# Patient Record
Sex: Male | Born: 1950 | Race: White | Hispanic: No | Marital: Married | Smoking: Never smoker
Health system: Southern US, Community
[De-identification: ages and names within clinical notes are randomized; demographics above are authoritative.]

## PROBLEM LIST (undated history)

## (undated) DIAGNOSIS — Z950 Presence of cardiac pacemaker: Secondary | ICD-10-CM

## (undated) DIAGNOSIS — M1990 Unspecified osteoarthritis, unspecified site: Secondary | ICD-10-CM

## (undated) DIAGNOSIS — R0600 Dyspnea, unspecified: Secondary | ICD-10-CM

## (undated) DIAGNOSIS — I10 Essential (primary) hypertension: Secondary | ICD-10-CM

## (undated) DIAGNOSIS — I639 Cerebral infarction, unspecified: Secondary | ICD-10-CM

## (undated) DIAGNOSIS — I509 Heart failure, unspecified: Secondary | ICD-10-CM

## (undated) DIAGNOSIS — K219 Gastro-esophageal reflux disease without esophagitis: Secondary | ICD-10-CM

## (undated) DIAGNOSIS — M329 Systemic lupus erythematosus, unspecified: Secondary | ICD-10-CM

## (undated) DIAGNOSIS — Z9889 Other specified postprocedural states: Secondary | ICD-10-CM

## (undated) DIAGNOSIS — K529 Noninfective gastroenteritis and colitis, unspecified: Secondary | ICD-10-CM

## (undated) DIAGNOSIS — G8929 Other chronic pain: Secondary | ICD-10-CM

## (undated) DIAGNOSIS — J45909 Unspecified asthma, uncomplicated: Secondary | ICD-10-CM

## (undated) DIAGNOSIS — E785 Hyperlipidemia, unspecified: Secondary | ICD-10-CM

## (undated) DIAGNOSIS — R112 Nausea with vomiting, unspecified: Secondary | ICD-10-CM

## (undated) DIAGNOSIS — R011 Cardiac murmur, unspecified: Secondary | ICD-10-CM

## (undated) DIAGNOSIS — G4730 Sleep apnea, unspecified: Secondary | ICD-10-CM

## (undated) DIAGNOSIS — J449 Chronic obstructive pulmonary disease, unspecified: Secondary | ICD-10-CM

## (undated) DIAGNOSIS — M545 Low back pain, unspecified: Secondary | ICD-10-CM

## (undated) HISTORY — PX: BACK SURGERY: SHX140

## (undated) HISTORY — PX: AORTIC VALVE REPLACEMENT: SHX41

## (undated) HISTORY — PX: INSERT / REPLACE / REMOVE PACEMAKER: SUR710

## (undated) HISTORY — PX: CARDIAC CATHETERIZATION: SHX172

## (undated) HISTORY — PX: CHOLECYSTECTOMY: SHX55

## (undated) HISTORY — PX: HERNIA REPAIR: SHX51

---

## 2008-03-08 DIAGNOSIS — I7300 Raynaud's syndrome without gangrene: Secondary | ICD-10-CM | POA: Insufficient documentation

## 2009-03-06 DIAGNOSIS — K219 Gastro-esophageal reflux disease without esophagitis: Secondary | ICD-10-CM | POA: Insufficient documentation

## 2009-08-09 DIAGNOSIS — Q231 Congenital insufficiency of aortic valve: Secondary | ICD-10-CM | POA: Insufficient documentation

## 2011-04-18 DIAGNOSIS — Z7901 Long term (current) use of anticoagulants: Secondary | ICD-10-CM | POA: Insufficient documentation

## 2013-04-16 DIAGNOSIS — M7541 Impingement syndrome of right shoulder: Secondary | ICD-10-CM | POA: Insufficient documentation

## 2015-05-29 DIAGNOSIS — Z950 Presence of cardiac pacemaker: Secondary | ICD-10-CM | POA: Insufficient documentation

## 2015-05-29 DIAGNOSIS — I2510 Atherosclerotic heart disease of native coronary artery without angina pectoris: Secondary | ICD-10-CM | POA: Insufficient documentation

## 2015-05-29 DIAGNOSIS — I4891 Unspecified atrial fibrillation: Secondary | ICD-10-CM | POA: Insufficient documentation

## 2015-10-28 DIAGNOSIS — E7800 Pure hypercholesterolemia, unspecified: Secondary | ICD-10-CM | POA: Insufficient documentation

## 2015-10-28 DIAGNOSIS — R911 Solitary pulmonary nodule: Secondary | ICD-10-CM | POA: Insufficient documentation

## 2016-04-15 DIAGNOSIS — E785 Hyperlipidemia, unspecified: Secondary | ICD-10-CM | POA: Insufficient documentation

## 2017-11-04 HISTORY — PX: OTHER SURGICAL HISTORY: SHX169

## 2018-03-19 DIAGNOSIS — I495 Sick sinus syndrome: Secondary | ICD-10-CM | POA: Insufficient documentation

## 2018-03-20 DIAGNOSIS — G4731 Primary central sleep apnea: Secondary | ICD-10-CM | POA: Insufficient documentation

## 2019-06-17 DIAGNOSIS — I493 Ventricular premature depolarization: Secondary | ICD-10-CM | POA: Insufficient documentation

## 2020-02-02 DIAGNOSIS — I509 Heart failure, unspecified: Secondary | ICD-10-CM | POA: Insufficient documentation

## 2020-04-06 ENCOUNTER — Emergency Department (HOSPITAL_BASED_OUTPATIENT_CLINIC_OR_DEPARTMENT_OTHER)
Admission: EM | Admit: 2020-04-06 | Discharge: 2020-04-06 | Disposition: A | Discharge status: Home / Self Care | Payer: Medicare Other | Attending: Emergency Medicine | Admitting: Emergency Medicine

## 2020-04-06 ENCOUNTER — Encounter (HOSPITAL_BASED_OUTPATIENT_CLINIC_OR_DEPARTMENT_OTHER): Payer: Self-pay | Admitting: *Deleted

## 2020-04-06 ENCOUNTER — Other Ambulatory Visit: Payer: Self-pay

## 2020-04-06 DIAGNOSIS — G8929 Other chronic pain: Secondary | ICD-10-CM | POA: Diagnosis not present

## 2020-04-06 DIAGNOSIS — Z79899 Other long term (current) drug therapy: Secondary | ICD-10-CM | POA: Insufficient documentation

## 2020-04-06 DIAGNOSIS — I509 Heart failure, unspecified: Secondary | ICD-10-CM | POA: Insufficient documentation

## 2020-04-06 DIAGNOSIS — R21 Rash and other nonspecific skin eruption: Secondary | ICD-10-CM | POA: Insufficient documentation

## 2020-04-06 DIAGNOSIS — M545 Low back pain: Secondary | ICD-10-CM | POA: Diagnosis present

## 2020-04-06 DIAGNOSIS — Z7901 Long term (current) use of anticoagulants: Secondary | ICD-10-CM | POA: Diagnosis not present

## 2020-04-06 DIAGNOSIS — J449 Chronic obstructive pulmonary disease, unspecified: Secondary | ICD-10-CM | POA: Diagnosis not present

## 2020-04-06 DIAGNOSIS — I110 Hypertensive heart disease with heart failure: Secondary | ICD-10-CM | POA: Insufficient documentation

## 2020-04-06 DIAGNOSIS — M5442 Lumbago with sciatica, left side: Secondary | ICD-10-CM | POA: Diagnosis not present

## 2020-04-06 HISTORY — DX: Low back pain, unspecified: M54.50

## 2020-04-06 HISTORY — DX: Hyperlipidemia, unspecified: E78.5

## 2020-04-06 HISTORY — DX: Essential (primary) hypertension: I10

## 2020-04-06 HISTORY — DX: Other chronic pain: G89.29

## 2020-04-06 HISTORY — DX: Cerebral infarction, unspecified: I63.9

## 2020-04-06 HISTORY — DX: Heart failure, unspecified: I50.9

## 2020-04-06 HISTORY — DX: Unspecified asthma, uncomplicated: J45.909

## 2020-04-06 HISTORY — DX: Chronic obstructive pulmonary disease, unspecified: J44.9

## 2020-04-06 MED ORDER — OXYCODONE-ACETAMINOPHEN 5-325 MG PO TABS
1.0000 | ORAL_TABLET | Freq: Once | ORAL | Status: AC
  Administered 2020-04-06: 1 via ORAL
  Filled 2020-04-06: qty 1

## 2020-04-06 MED ORDER — ONDANSETRON HCL 4 MG PO TABS: 4.0000 mg | ORAL_TABLET | Freq: Four times a day (QID) | ORAL | 0 refills | Status: AC

## 2020-04-06 MED ORDER — OXYCODONE-ACETAMINOPHEN 5-325 MG PO TABS: 2.0000 | ORAL_TABLET | ORAL | 0 refills | Status: DC | PRN

## 2020-04-06 MED ORDER — ONDANSETRON 4 MG PO TBDP
4.0000 mg | ORAL_TABLET | Freq: Once | ORAL | Status: AC
  Administered 2020-04-06: 4 mg via ORAL
  Filled 2020-04-06: qty 1

## 2020-04-06 NOTE — Discharge Instructions (Signed)
Please take the pain medication as prescribed.  Please return to the ER if you have increasing numbness, inability to hold your bowel or bladder, sudden inability to walk, increasing in fever/chills.  Please follow-up with your spine doctor within the next few days.

## 2020-04-06 NOTE — ED Provider Notes (Signed)
[facility] EMERGENCY DEPARTMENT Provider Note   CSN: [id number] Arrival date & time: 04/06/20  1037     History Chief Complaint  Patient presents with  . Back Pain    Brent Nichols is a 69 y.o. male.  HPI 69 year old male with a history of chronic bilateral low back pain, asthma, stroke, COPD, hypertension, CHF, hyperlipidemia presents to the ER with worsening left-sided anterior groin and thigh pain x1 day.  History provided by patient and wife who is at bedside.  He reports chronic bilateral low back pain, with "severe nerve damage" in his left leg per his recent neuro conductive study.  He states he is scheduled to have back surgery soon but is waiting on cardiac clearance.  He states that yesterday he had severe pain to his anterior thigh and groin that woke him up in the morning.  He states he has not been able to move his left leg much secondary to the pain.  He states he has a baseline weakness and numbness to that left leg, but no increase in weakness.  No groin numbness, no bowel or bladder incontinence, no sudden foot drop.  Patient states that he spoke with his primary care doctor who told him to come to the ER for pain management.  He also refers that he has started metoprolol and Plavix recently, and has developed a petechial rash on his forearms, legs and abdomen.  The rash is nonpainful, and not itchy.  Per chart review, patient was seen on 03/30/2020 by his PCP for this, INR was within therapeutic range and a CBC was done which showed no acute abnormalities.  Patient states that he takes hydrocodone for his pain, but his medication has not "touched" this increase in pain for him.  He has had no fevers, chills, increasing weakness, night sweats, no history of cancer, no dysuria, testicular swelling, penile pain, history of hernias..    Past Medical History:  Diagnosis Date  . Asthma   . CHF (congestive heart failure) (HCC)   . Chronic bilateral low back pain   . COPD  (chronic obstructive pulmonary disease) (HCC)   . Hyperlipidemia   . Hypertension   . Stroke [facility])     There are no problems to display for this patient.   Past Surgical History:  Procedure Laterality Date  . AORTIC VALVE REPLACEMENT    . BACK SURGERY    . CHOLECYSTECTOMY    . HERNIA REPAIR         History reviewed. No pertinent family history.  Social History   Tobacco Use  . Smoking status: Never Smoker  . Smokeless tobacco: Never Used  Substance Use Topics  . Alcohol use: Not Currently  . Drug use: Not Currently    Home Medications Prior to Admission medications   Medication Sig Start Date End Date Taking? Authorizing Provider  clopidogrel (PLAVIX) 75 MG tablet Take by mouth. 04/04/20 04/04/21 Yes [provider]  esomeprazole (NEXIUM) 40 MG capsule TAKE 1 CAPSULE BY MOUTH EVERY DAY 10/22/19  Yes [provider]  furosemide (LASIX) 40 MG tablet TAKE 1 TABLET BY MOUTH EVERY DAY 01/05/13  Yes [provider]  gabapentin (NEURONTIN) 100 MG capsule Take by mouth. 11/05/16  Yes [provider]  HYDROcodone-acetaminophen (NORCO) 10-325 MG tablet Take by mouth. 04/12/16 04/22/20 Yes [provider]  metoprolol succinate (TOPROL-XL) 50 MG 24 hr tablet Take by mouth. 01/18/20  Yes [provider]  montelukast (SINGULAIR) 10 MG tablet Take by  mouth. 08/14/18  Yes [provider]  rosuvastatin (CRESTOR) 20 MG tablet Take by mouth.   Yes [provider]  warfarin (COUMADIN) 5 MG tablet Take by mouth.   Yes [provider]  albuterol (PROVENTIL) (2.5 MG/3ML) 0.083% nebulizer solution Inhale into the lungs.    [provider]  oxyCODONE-acetaminophen (PERCOCET/ROXICET) 5-325 MG tablet Take 2 tablets by mouth every 4 (four) hours as needed for severe pain. 04/06/20   Mare Ferrari, PA-C    Allergies    Codeine  Review of Systems   Review of Systems  Constitutional: Negative for chills and fatigue.    Gastrointestinal: Negative for abdominal pain, diarrhea and vomiting.  Genitourinary: Negative for dysuria, penile pain, scrotal swelling and testicular pain.  Musculoskeletal: Positive for arthralgias and back pain. Negative for joint swelling, myalgias, neck pain and neck stiffness.  Skin: Positive for rash. Negative for color change and wound.  Neurological: Positive for weakness (Chronic, no increase from baseline) and numbness (Chronic, no increase from baseline).    Physical Exam Updated Vital Signs BP 102/71 (BP Location: Right Arm)   Pulse 66   Temp 98.5 F (36.9 C) (Oral)   Resp 16   Ht 5\' 9"  (1.753 m)   Wt 96.2 kg   SpO2 97%   BMI 31.31 kg/m   Physical Exam Vitals and nursing note reviewed.  Constitutional:      General: He is not in acute distress.    Appearance: Normal appearance. He is well-developed. He is not ill-appearing, toxic-appearing or diaphoretic.  HENT:     Head: Normocephalic and atraumatic.     Nose: Nose normal.     Mouth/Throat:     Mouth: Mucous membranes are moist.  Eyes:     Conjunctiva/sclera: Conjunctivae normal.     Pupils: Pupils are equal, round, and reactive to light.  Cardiovascular:     Rate and Rhythm: Normal rate and regular rhythm.     Heart sounds: No murmur.  Pulmonary:     Effort: Pulmonary effort is normal. No respiratory distress.     Breath sounds: Normal breath sounds.  Abdominal:     Palpations: Abdomen is soft. There is no mass.     Tenderness: There is no abdominal tenderness.  Musculoskeletal:        General: Tenderness present. No swelling, deformity or signs of injury.     Cervical back: Neck supple.     Right lower leg: No edema.     Left lower leg: No edema.     Comments: 3/5 left lower extremity weakness with plantarflexion, 4/5 with flexion and extension of the knee, baseline numbness in left leg.  No L-spine, T-spine, C-spine tenderness.  Range of motion to left leg decreased secondary to pain, but I am still  able to flex and extend at the hip with passive motion.  No evidence of erythema, cellulitis, swelling.  2+ pedal pulses bilaterally.  5/5 strength, sensations intact, full range of motion of lower extremity with flexion, extension, dorsiflexion, plantarflexion.  No evidence of hernia in the groin, no testicular swelling, normal appearing penis.  No swelling or erythema overlying the left knee.  No edema, signs of injury, deformities.  Skin:    General: Skin is warm and dry.     Findings: Erythema and rash present. No bruising.     Comments: Petechial rash on ventral aspects of his forearms, scattered across his abdomen, and on anterior shins.  None evidence of bullae, sloughing, blisters, pustules,  no warmth, draining sinus tracts, no superficial abscesses, no vesicles, desquamation, no target lesions with dusky purpura or central bulla.  Not tender to touch.    Neurological:     Mental Status: He is alert.   .  ED Results / Procedures / Treatments   Labs (all labs ordered are listed, but only abnormal results are displayed) Labs Reviewed - No data to display  EKG None  Radiology No results found.  Procedures Procedures (including critical care time)  Medications Ordered in ED Medications  oxyCODONE-acetaminophen (PERCOCET/ROXICET) 5-325 MG per tablet 1 tablet (1 tablet Oral Given 04/06/20 1216)  ondansetron (ZOFRAN-ODT) disintegrating tablet 4 mg (4 mg Oral Given 04/06/20 1216)    ED Course  I have reviewed the triage vital signs and the nursing notes.  Pertinent labs & imaging results that were available during my care of the patient were reviewed by me and considered in my medical decision making (see chart for details).    MDM Rules/Calculators/A&P                     69 year old male with increasing left leg pain in the setting of chronic low back pain. On presentation to the ER, the patient is alert and oriented, nontoxic-appearing, appears to be mildly in pain, sitting up  on the bed, but in no acute distress.  Vitals are overall reassuring, he is afebrile.  Physical exam with limited range of motion secondary to pain in the left leg, baseline weakness and numbness per patient.  I was still able to passively flex and extend his legs at the hip and knee.  No evidence of septic joint, he is not having any red flag signs concerning for cauda equina or severe spinal stenosis.  I discussed the goals of care with the patient and his wife, their main concern is pain control.  Patient has a mechanical mitral valve and cannot receive an MRI, and we do not have MRI here at [facility].  His last CT was on 03/16/2020 with moderate central canal stenosis at L1-5, and remote L2 compression deformity with no acute fractures.  Patient has not had any recent falls or reinjuries.  I do not think a repeat CT is warranted at this time, as his low back problems are chronic.  I discussed this with the patient and his wife and they are agreeable to not repeating a CT at this time.  Pain treated with Percocet in the ER.  We have agreed on increasing his pain medication to Percocet to hold him over until he gets cardiac clearance and can schedule surgery.  Patient and his wife are agreeable to this.   In regards to his rash, he reports that her has been no changes or increases in the spread of his rash since his appointment on 03/30/2020.  INR and CBC checked then without abnormalities.  There are no signs of SJS, TN, doubt ITP.  I do not think there is an indication to recheck a CBC today.  The pain is not itchy or painful.  Concern for cellulitis low.   The patient was also seen and evaluated by Dr. Rush Landmark who is agreeable to the above plan. Final Clinical Impression(s) / ED Diagnoses Final diagnoses:  Chronic left-sided low back pain with left-sided sciatica    Rx / DC Orders ED Discharge Orders         Ordered    oxyCODONE-acetaminophen (PERCOCET/ROXICET) 5-325 MG tablet  Every 4 hours PRN  04/06/20 [place], PA-C 04/06/20 1308    Tegeler, Gwenyth Allegra, MD 04/06/20 2059

## 2020-04-06 NOTE — ED Triage Notes (Signed)
Pt c/o left lower back pain x 1 day , hx chronic back pain with plans for surgery

## 2020-04-07 ENCOUNTER — Other Ambulatory Visit: Payer: Self-pay | Admitting: Neurological Surgery

## 2020-04-12 NOTE — Progress Notes (Signed)
Your procedure is scheduled on Monday June 14.  Report to [facility] Main Entrance "A" at 05:30 A.M., and check in at the Admitting office.  Call this number if you have problems the morning of surgery: [phone]  Call [phone] if you have any questions prior to your surgery date Monday-Friday 8am-4pm   Remember: Do not eat or drink after midnight the night before your surgery  Take these medicines the morning of surgery with A SIP OF WATER: albuterol (PROVENTIL) nebulizer  esomeprazole (NEXIUM) fluticasone (FLONASE)  fluticasone (FLOVENT HFA) inhaler metoprolol succinate (TOPROL-XL) rosuvastatin (CRESTOR)    IF NEEDED: albuterol (VENTOLIN HFA) inhaler -------- Please bring all inhalers with you the day of surgery.  meclizine (ANTIVERT) ondansetron (ZOFRAN) oxyCODONE-acetaminophen (PERCOCET/ROXICET)   Follow your surgeon's instructions on when to stop clopidogrel (PLAVIX) and warfarin (COUMADIN).   >>If no instructions were given by your surgeon then you will need to call the office to get those instructions.    As of today, STOP taking any Aspirin/Aspirin containing products, Aleve, Naproxen, Ibuprofen, Motrin, Advil, Goody's, BC's, all herbal medications, fish oil, and all vitamins.    The Morning of Surgery  Do not wear jewelry, make-up or nail polish.  Do not wear lotions, powders, colognes, or deodorant Men may shave face and neck.  Do not bring valuables to the hospital.  [facility] is not responsible for any belongings or valuables.  If you are a smoker, DO NOT Smoke 24 hours prior to surgery  If you wear a CPAP at night please bring your mask the morning of surgery   Remember that you must have someone to transport you home after your surgery, and remain with you for 24 hours if you are discharged the same day.   Please bring cases for contacts, glasses, hearing aids, dentures or bridgework because it cannot be worn into surgery.    Leave your  suitcase in the car.  After surgery it may be brought to your room.  For patients admitted to the hospital, discharge time will be determined by your treatment team.  Patients discharged the day of surgery will not be allowed to drive home.    Special instructions:   [place]- Preparing For Surgery  Before surgery, you can play an important role. Because skin is not sterile, your skin needs to be as free of germs as possible. You can reduce the number of germs on your skin by washing with CHG (chlorahexidine gluconate) Soap before surgery.  CHG is an antiseptic cleaner which kills germs and bonds with the skin to continue killing germs even after washing.    Oral Hygiene is also important to reduce your risk of infection.  Remember - BRUSH YOUR TEETH THE MORNING OF SURGERY WITH YOUR REGULAR TOOTHPASTE  Please do not use if you have an allergy to CHG or antibacterial soaps. If your skin becomes reddened/irritated stop using the CHG.  Do not shave (including legs and underarms) for at least 48 hours prior to first CHG shower. It is OK to shave your face.  Please follow these instructions carefully.   1. Shower the NIGHT BEFORE SURGERY and the MORNING OF SURGERY with CHG Soap.   2. If you chose to wash your hair and body, wash as usual with your normal shampoo and body-wash/soap.  3. Rinse your hair and body thoroughly to remove the shampoo and soap.  4. Apply CHG directly to the skin (ONLY FROM THE NECK DOWN) and wash gently with a  scrungie or a clean washcloth.   5. Do not use on open wounds or open sores. Avoid contact with your eyes, ears, mouth and genitals (private parts). Wash Face and genitals (private parts)  with your normal soap.   6. Wash thoroughly, paying special attention to the area where your surgery will be performed.  7. Thoroughly rinse your body with warm water from the neck down.  8. DO NOT shower/wash with your normal soap after using and rinsing off the CHG  Soap.  9. Pat yourself dry with a CLEAN TOWEL.  10. Wear CLEAN PAJAMAS to bed the night before surgery  11. Place CLEAN SHEETS on your bed the night of your first shower and DO NOT SLEEP WITH PETS.  12. Wear comfortable clothes the morning of surgery.     Day of Surgery:  Please shower the morning of surgery with the CHG soap Do not apply any deodorants/lotions. Please wear clean clothes to the hospital/surgery center.   Remember to brush your teeth WITH YOUR REGULAR TOOTHPASTE.   Please read over the following fact sheets that you were given.

## 2020-04-12 NOTE — Progress Notes (Signed)
Pt has procedure scheduled with Dr. Yetta Barre 04/17/20 @ 0730. Pt has pacemaker, device orders requested via email. Arlys John Small called and notified of pt procedure, date, and time. Cyndia Diver, RN cc'd.

## 2020-04-13 ENCOUNTER — Encounter (HOSPITAL_COMMUNITY): Payer: Self-pay

## 2020-04-13 ENCOUNTER — Other Ambulatory Visit: Payer: Self-pay

## 2020-04-13 ENCOUNTER — Other Ambulatory Visit (HOSPITAL_COMMUNITY)
Admission: RE | Admit: 2020-04-13 | Discharge: 2020-04-13 | Disposition: A | Discharge status: Home / Self Care | Payer: Medicare Other | Source: Ambulatory Visit | Attending: Neurological Surgery | Admitting: Neurological Surgery

## 2020-04-13 ENCOUNTER — Encounter (HOSPITAL_COMMUNITY)
Admission: RE | Admit: 2020-04-13 | Discharge: 2020-04-13 | Disposition: A | Discharge status: Home / Self Care | Payer: Medicare Other | Source: Ambulatory Visit | Attending: Neurological Surgery | Admitting: Neurological Surgery

## 2020-04-13 DIAGNOSIS — Z7951 Long term (current) use of inhaled steroids: Secondary | ICD-10-CM | POA: Diagnosis not present

## 2020-04-13 DIAGNOSIS — K219 Gastro-esophageal reflux disease without esophagitis: Secondary | ICD-10-CM | POA: Insufficient documentation

## 2020-04-13 DIAGNOSIS — Z20822 Contact with and (suspected) exposure to covid-19: Secondary | ICD-10-CM | POA: Insufficient documentation

## 2020-04-13 DIAGNOSIS — J449 Chronic obstructive pulmonary disease, unspecified: Secondary | ICD-10-CM | POA: Insufficient documentation

## 2020-04-13 DIAGNOSIS — Z952 Presence of prosthetic heart valve: Secondary | ICD-10-CM | POA: Diagnosis not present

## 2020-04-13 DIAGNOSIS — G4730 Sleep apnea, unspecified: Secondary | ICD-10-CM | POA: Diagnosis not present

## 2020-04-13 DIAGNOSIS — I509 Heart failure, unspecified: Secondary | ICD-10-CM | POA: Diagnosis not present

## 2020-04-13 DIAGNOSIS — Z8673 Personal history of transient ischemic attack (TIA), and cerebral infarction without residual deficits: Secondary | ICD-10-CM | POA: Diagnosis not present

## 2020-04-13 DIAGNOSIS — Z01812 Encounter for preprocedural laboratory examination: Secondary | ICD-10-CM | POA: Insufficient documentation

## 2020-04-13 DIAGNOSIS — Z950 Presence of cardiac pacemaker: Secondary | ICD-10-CM | POA: Insufficient documentation

## 2020-04-13 DIAGNOSIS — Z7901 Long term (current) use of anticoagulants: Secondary | ICD-10-CM | POA: Diagnosis not present

## 2020-04-13 DIAGNOSIS — Z79899 Other long term (current) drug therapy: Secondary | ICD-10-CM | POA: Diagnosis not present

## 2020-04-13 DIAGNOSIS — M329 Systemic lupus erythematosus, unspecified: Secondary | ICD-10-CM | POA: Diagnosis not present

## 2020-04-13 DIAGNOSIS — I110 Hypertensive heart disease with heart failure: Secondary | ICD-10-CM | POA: Diagnosis not present

## 2020-04-13 DIAGNOSIS — M48061 Spinal stenosis, lumbar region without neurogenic claudication: Secondary | ICD-10-CM | POA: Insufficient documentation

## 2020-04-13 DIAGNOSIS — I480 Paroxysmal atrial fibrillation: Secondary | ICD-10-CM | POA: Diagnosis not present

## 2020-04-13 DIAGNOSIS — E785 Hyperlipidemia, unspecified: Secondary | ICD-10-CM | POA: Insufficient documentation

## 2020-04-13 HISTORY — DX: Presence of cardiac pacemaker: Z95.0

## 2020-04-13 HISTORY — DX: Systemic lupus erythematosus, unspecified: M32.9

## 2020-04-13 HISTORY — DX: Gastro-esophageal reflux disease without esophagitis: K21.9

## 2020-04-13 HISTORY — DX: Unspecified osteoarthritis, unspecified site: M19.90

## 2020-04-13 HISTORY — DX: Dyspnea, unspecified: R06.00

## 2020-04-13 HISTORY — DX: Other specified postprocedural states: R11.2

## 2020-04-13 HISTORY — DX: Sleep apnea, unspecified: G47.30

## 2020-04-13 HISTORY — DX: Noninfective gastroenteritis and colitis, unspecified: K52.9

## 2020-04-13 HISTORY — DX: Cardiac murmur, unspecified: R01.1

## 2020-04-13 HISTORY — DX: Other specified postprocedural states: Z98.890

## 2020-04-13 LAB — BASIC METABOLIC PANEL
Anion gap: 10 (ref 5–15)
BUN: 19 mg/dL (ref 8–23)
CO2: 19 mmol/L — ABNORMAL LOW (ref 22–32)
Calcium: 9 mg/dL (ref 8.9–10.3)
Chloride: 109 mmol/L (ref 98–111)
Creatinine, Ser: 0.88 mg/dL (ref 0.61–1.24)
GFR calc Af Amer: >60 mL/min (ref >60)
GFR calc non Af Amer: >60 mL/min (ref >60)
Glucose, Bld: 109 mg/dL — ABNORMAL HIGH (ref 70–99)
Potassium: 3.9 mmol/L (ref 3.5–5.1)
Sodium: 138 mmol/L (ref 135–145)

## 2020-04-13 LAB — CBC WITH DIFFERENTIAL/PLATELET
Abs Immature Granulocytes: 0.01 10*3/uL (ref 0.00–0.07)
Basophils Absolute: 0.1 10*3/uL (ref 0.0–0.1)
Basophils Relative: 2 %
Eosinophils Absolute: 0.4 10*3/uL (ref 0.0–0.5)
Eosinophils Relative: 6 %
HCT: 43.3 % (ref 39.0–52.0)
Hemoglobin: 14.4 g/dL (ref 13.0–17.0)
Immature Granulocytes: 0 %
Lymphocytes Relative: 37 %
Lymphs Abs: 2.5 10*3/uL (ref 0.7–4.0)
MCH: 32 pg (ref 26.0–34.0)
MCHC: 33.3 g/dL (ref 30.0–36.0)
MCV: 96.2 fL (ref 80.0–100.0)
Monocytes Absolute: 0.8 10*3/uL (ref 0.1–1.0)
Monocytes Relative: 13 %
Neutro Abs: 2.9 10*3/uL (ref 1.7–7.7)
Neutrophils Relative %: 42 %
Platelets: 191 10*3/uL (ref 150–400)
RBC: 4.5 MIL/uL (ref 4.22–5.81)
RDW: 13.5 % (ref 11.5–15.5)
WBC: 6.7 10*3/uL (ref 4.0–10.5)
nRBC: 0 % (ref 0.0–0.2)

## 2020-04-13 LAB — SURGICAL PCR SCREEN
MRSA, PCR: NEGATIVE
Staphylococcus aureus: NEGATIVE

## 2020-04-13 LAB — PROTIME-INR
INR: 1.4 — ABNORMAL HIGH (ref 0.8–1.2)
Prothrombin Time: 16.9 seconds — ABNORMAL HIGH (ref 11.4–15.2)

## 2020-04-13 LAB — SARS CORONAVIRUS 2 (TAT 6-24 HRS): SARS Coronavirus 2: NEGATIVE

## 2020-04-13 NOTE — Progress Notes (Signed)
PCP - Carron Curie @ [facility]  Cardiologist - Drucker @ [facility] Pulm: Su Monks PPM/ICD - pacemaker Device Orders - faxed to Dr. Mayme Genta Rep Notified - yes, by Tawanna Sat R.N.  Chest x-ray - 01-12-20 in care everywhere EKG - requested from [facility] Stress Test - request ECHO - request Cardiac Cath - na  Sleep Study - 2 yrs. Ago @ [facility] --Pt. Has a Respicardia, states it comes on 2230 every evening and goes off at 0700 everyday.  Fasting Blood Sugar - na   Blood Thinner Instructions: stopped coumadin and plavix 04-09-20, pt. Started on lovenox 04/09/20 and last dose 04/16/20 Aspirin Instructions:  ERAS Protcol -na   COVID TEST- 04/13/20   Anesthesia review: cardiac,pacemaker, nerve stimulator  Patient denies shortness of breath, fever, cough and chest pain at PAT appointment   All instructions explained to the patient, with a verbal understanding of the material. Patient agrees to go over the instructions while at home for a better understanding. Patient also instructed to self quarantine after being tested for COVID-19. The opportunity to ask questions was provided.

## 2020-04-14 ENCOUNTER — Encounter (HOSPITAL_COMMUNITY): Payer: Self-pay

## 2020-04-14 NOTE — Progress Notes (Signed)
Anesthesia Chart Review:  Case: [id number] Date/Time: 04/17/20 0715   Procedure: Laminectomy and Foraminotomy - L1-L2 - L2-L3 - L3-L4 - L4-L5 (N/A Back)   Anesthesia type: General   Pre-op diagnosis: Stenosis   Location: [facility] OR ROOM 20 / [place] OR   Surgeons: Eustace Moore, MD      DISCUSSION: Patient is a 69 year old male scheduled for the above procedure.  History includes never smoker, post-operative N/V, HTN, HLD, COPD, asthma, CHF, LV dysfunction (LVEF 40-50% 07/2019 echocardiograms. 42% 03/2020 stress test), murmur (s/p St. Jude mechanical AVR; 04/15/16 [facility] note indicates [place] procedure 2005), SSS (s/p St. Jude Assurity MRI PPM pacemaker; generator change 04/06/18), PAF, exertional dyspnea, CVA (~ 2012), central sleep apnea (s/p Rescpicardia phrenic nerve stimulator 2019), GERD, Lupus, ulcerative colitis, chronic low back pain (history back surgery), cholecystectomy.  Evaluated 04/10/20 by PCP Bolivar Haw, PA following ED visit for severe BLE pain, awaiting back surgery. She notes cardiologist Dr. Georg Ruddle had given permission to hold Plavix and arrange Lovenox bridge while warfarin on hold for surgery. Low risk stress test 03/29/20.   Preoperative PT/INR elevated at 16.9/1.4, will order repeat PT/INR on arrival. Last warfarin and Plavix 04/09/20, preoperative Lovenox bridge 04/09/20-04/16/20.   He has a phrenic nerve stimulator that has parameters to turn on at 2230 and turn off at 0700.  Preoperative PPM perioperative device RX form still pending from [facility] (Dr. Georg Ruddle).  04/13/20 preoperative COVID-19 test negative. Anesthesia team to evaluate on the day of surgery.    VS: BP 138/63    Pulse (!) 57    Temp 36.6 C (Oral)    Resp 18    Ht 5\' 9"  (1.753 m)    Wt 94.9 kg    SpO2 98%    BMI 30.90 kg/m    PROVIDERS: Bolivar Haw, PA is PCP [facility], see Care Everywhere)  Wendie Chess, MD is cardiologist ([place], see Care Everywhere). Last evaluation 03/09/20 by Sigurd Sos, NP. She notes dyspnea improved since starting Entresto. BP and weight stable. He did have some atypical chest discomfort, so stress test was ordered which was low risk.   Francena Hanly, MD is pulmonologist ([place]). Last evaluation 08/05/19.   Chales Salmon, MD is neurologist ([place])   LABS: Labs reviewed: Repeat PT/INR on the day of surgery. (all labs ordered are listed, but only abnormal results are displayed)  Labs Reviewed  BASIC METABOLIC PANEL - Abnormal; Notable for the following components:      Result Value   CO2 19 (*)    Glucose, Bld 109 (*)    All other components within normal limits  PROTIME-INR - Abnormal; Notable for the following components:   Prothrombin Time 16.9 (*)    INR 1.4 (*)    All other components within normal limits  SURGICAL PCR SCREEN  CBC WITH DIFFERENTIAL/PLATELET    Spirometry 08/24/13 ([place]): SPIROMETRY: FEV1 FVC RATIO  10/14 2.8L / 84% 3.5L / 79% 0.80  Post albuterol inhalation: FVC and FEV1 incr 272ml absolute but only 6% increase With FVC maneuvers, got faint and had "shaking all over", "panicky" Borderline low FVC, no obstruction by spirometric criteria.   IMAGES: CT L-spine 03/16/20 ([place]): IMPRESSION:  1. Moderate central canal stenosis is present at the levels of L1-2, L2-3, and L4-5.  2. Remote L2 compression deformity. No acute fractures.  3. Mild dextroscoliosis.   CXR 01/12/20 [facility]  Care Everywhere): FINDINGS:  - Cardiovascular: Stable cardiomediastinal contours. Status post cardiac aortic valvular prosthesis. Cardiac rhythm maintenance device remains in place.  - Lungs/pleura: Clear.  - Upper abdomen: No acute abnormality.  - Osseous structures: No acute abnormality. Median sternotomy.  - Additional comments: Spinal stimulator remains in place. CONCLUSION:  -There is no  evidence of acute cardiac or pulmonary abnormality.    EKG: 01/23/20 [facility]):  Atrial rhythm-occasional ectopic ventricular beat P:QRS-1-1, Abnormal P axis, HR 71 bpm Nonspecific QRS widening and anterior fascicular block Poor R wave progression-nonspecific, consider old anterior infarct Nonspecific T wave abnormality   CV: Nuclear stress test 03/29/20 [facility]): Impression:  1.  The post-rest left ventricle is enlarged in size.  There is no scintigraphic evidence of inducible myocardial ischemia.  The observed defect consistent with increased gut uptake. 2.  The post-rest left ventricular function is moderately decreased.  The post stress ejection fraction is 42%. 3.  No ECG changes. 4.  This was essentially a low risk scan.   Cardiac device remote check 03/05/20 [facility] Everywhere): Conclusions:  Normal Device Function with episodes of paroxysmal atrial fibrillation as  well as mode switches due to competitive pacing.    TEE 07/28/19 [facility] Everywhere): Impression:  Left Ventricle: EF 45-50%. Mild global hypokinesis.   Left Ventricle: The left ventricular cavity is normal.   Aortic Valve: St. Jude's mechanical valve. There is a bileaflet tilting  disc mechanical valve.   Aortic Valve: The prosthetic valve appears to be functioning normally.   Echocardiogram 07/06/19 [facility]): Summary: Complete 2D thoracic echocardiogram with color-flow Doppler and spectral Doppler was performed.  There is no thrombus. There is normal left ventricular wall thickness. The interatrial septum is intact with no evidence of an atrial septal defect. Aortic valve is trileaflet with thin, pliable leaflets of move normally. The left ventricle is normal in size. The left ventricular diastolic function is normal. The left ventricular ejection fraction is moderately reduced (40 to 45%). There is moderate diffuse hypokinesis of the left ventricle. There is a  pacemaker lead in the right ventricle. - Note: Patient with known St. 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 04/15/16 [facility] notes states, "Nonobstructive by cardiac catheter 2004".    Past Medical History:  Diagnosis Date   Arthritis    Asthma    CHF (congestive heart failure) (HCC)    Chronic bilateral low back pain    Colitis    history of   COPD (chronic obstructive pulmonary disease) (HCC)    Dyspnea    exerting self   GERD (gastroesophageal reflux disease)    Heart murmur    Hyperlipidemia    Hypertension    Lupus (HCC)    PONV (postoperative nausea and vomiting)    Presence of permanent cardiac pacemaker    Sleep apnea    Stroke (HCC)    6 yrs. ago    Past Surgical History:  Procedure Laterality Date   AORTIC VALVE REPLACEMENT     BACK SURGERY     CHOLECYSTECTOMY     HERNIA REPAIR     respicardia Right 2019    MEDICATIONS:  albuterol (PROVENTIL) (2.5 MG/3ML) 0.083% nebulizer solution   albuterol (VENTOLIN HFA) 108 (90 Base) MCG/ACT inhaler   clopidogrel (PLAVIX) 75 MG tablet   esomeprazole (NEXIUM) 40 MG capsule   fluticasone (FLONASE) 50 MCG/ACT nasal spray  fluticasone (FLOVENT HFA) 110 MCG/ACT inhaler   furosemide (LASIX) 40 MG tablet   meclizine (ANTIVERT) 25 MG tablet   metoprolol succinate (TOPROL-XL) 25 MG 24 hr tablet   ondansetron (ZOFRAN) 4 MG tablet   oxyCODONE-acetaminophen (PERCOCET/ROXICET) 5-325 MG tablet   rosuvastatin (CRESTOR) 20 MG tablet   sacubitril-valsartan (ENTRESTO) 49-51 MG   warfarin (COUMADIN) 3 MG tablet   No current facility-administered medications for this encounter.    Shonna Chock, PA-C Surgical Short Stay/Anesthesiology [facility] Phone [phone] [facility] Phone [phone] 04/14/2020 11:02 AM

## 2020-04-14 NOTE — Anesthesia Preprocedure Evaluation (Addendum)
Anesthesia Evaluation  Patient identified by MRN, date of birth, ID band Patient awake    Reviewed: Allergy & Precautions, H&P , NPO status , Patient's Chart, lab work & pertinent test results  History of Anesthesia Complications (+) PONV and history of anesthetic complications  Airway Mallampati: II   Neck ROM: full    Dental   Pulmonary shortness of breath, asthma , sleep apnea , COPD,    breath sounds clear to auscultation       Cardiovascular hypertension, + pacemaker + Valvular Problems/Murmurs  Rhythm:regular Rate:Normal  S/p AVR with St Jude valve   Neuro/Psych CVA    GI/Hepatic GERD  ,  Endo/Other    Renal/GU      Musculoskeletal  (+) Arthritis ,   Abdominal   Peds  Hematology   Anesthesia Other Findings   Reproductive/Obstetrics                             Anesthesia Physical Anesthesia Plan  ASA: III  Anesthesia Plan: General   Post-op Pain Management:    Induction: Intravenous  PONV Risk Score and Plan: 3 and Ondansetron, Dexamethasone, Midazolam and Treatment may vary due to age or medical condition  Airway Management Planned: Oral ETT  Additional Equipment:   Intra-op Plan:   Post-operative Plan: Extubation in OR  Informed Consent: I have reviewed the patients History and Physical, chart, labs and discussed the procedure including the risks, benefits and alternatives for the proposed anesthesia with the patient or authorized representative who has indicated his/her understanding and acceptance.       Plan Discussed with: CRNA, Anesthesiologist and Surgeon  Anesthesia Plan Comments: (PAT note written 04/14/2020 by Shonna Chock, PA-C. History St. Jude AVR, St. Jude PPM, CHF/LV dysfunction, PAF. On Lovenox bridge, last dose scheduled for 04/16/20.   He has a phrenic nerve stimulator that has parameters to turn on at 2230 and turn off at 0700.)        Anesthesia Quick Evaluation

## 2020-04-17 ENCOUNTER — Inpatient Hospital Stay (HOSPITAL_COMMUNITY): Payer: Medicare Other | Admitting: Vascular Surgery

## 2020-04-17 ENCOUNTER — Other Ambulatory Visit: Payer: Self-pay

## 2020-04-17 ENCOUNTER — Inpatient Hospital Stay (HOSPITAL_COMMUNITY)
Admission: RE | Admit: 2020-04-17 | Discharge: 2020-04-17 | DRG: 515 | Disposition: A | Discharge status: Home / Self Care | Payer: Medicare Other | Attending: Neurological Surgery | Admitting: Neurological Surgery

## 2020-04-17 ENCOUNTER — Inpatient Hospital Stay (HOSPITAL_COMMUNITY): Payer: Medicare Other

## 2020-04-17 ENCOUNTER — Encounter (HOSPITAL_COMMUNITY): Payer: Self-pay | Admitting: Neurological Surgery

## 2020-04-17 ENCOUNTER — Encounter (HOSPITAL_COMMUNITY)
Admission: RE | Disposition: A | Discharge status: Home / Self Care | Payer: Self-pay | Source: Home / Self Care | Attending: Neurological Surgery

## 2020-04-17 DIAGNOSIS — G4731 Primary central sleep apnea: Secondary | ICD-10-CM | POA: Diagnosis present

## 2020-04-17 DIAGNOSIS — I509 Heart failure, unspecified: Secondary | ICD-10-CM | POA: Diagnosis present

## 2020-04-17 DIAGNOSIS — Z7902 Long term (current) use of antithrombotics/antiplatelets: Secondary | ICD-10-CM

## 2020-04-17 DIAGNOSIS — Z952 Presence of prosthetic heart valve: Secondary | ICD-10-CM | POA: Diagnosis not present

## 2020-04-17 DIAGNOSIS — Z79899 Other long term (current) drug therapy: Secondary | ICD-10-CM | POA: Diagnosis not present

## 2020-04-17 DIAGNOSIS — I639 Cerebral infarction, unspecified: Secondary | ICD-10-CM | POA: Diagnosis present

## 2020-04-17 DIAGNOSIS — I110 Hypertensive heart disease with heart failure: Secondary | ICD-10-CM | POA: Diagnosis present

## 2020-04-17 DIAGNOSIS — Z885 Allergy status to narcotic agent status: Secondary | ICD-10-CM

## 2020-04-17 DIAGNOSIS — Z7901 Long term (current) use of anticoagulants: Secondary | ICD-10-CM | POA: Diagnosis not present

## 2020-04-17 DIAGNOSIS — K219 Gastro-esophageal reflux disease without esophagitis: Secondary | ICD-10-CM | POA: Diagnosis present

## 2020-04-17 DIAGNOSIS — Z419 Encounter for procedure for purposes other than remedying health state, unspecified: Secondary | ICD-10-CM

## 2020-04-17 DIAGNOSIS — J449 Chronic obstructive pulmonary disease, unspecified: Secondary | ICD-10-CM | POA: Diagnosis present

## 2020-04-17 DIAGNOSIS — Z950 Presence of cardiac pacemaker: Secondary | ICD-10-CM | POA: Diagnosis not present

## 2020-04-17 DIAGNOSIS — M48061 Spinal stenosis, lumbar region without neurogenic claudication: Principal | ICD-10-CM | POA: Diagnosis present

## 2020-04-17 DIAGNOSIS — Z9889 Other specified postprocedural states: Secondary | ICD-10-CM

## 2020-04-17 DIAGNOSIS — E785 Hyperlipidemia, unspecified: Secondary | ICD-10-CM | POA: Diagnosis present

## 2020-04-17 HISTORY — PX: LUMBAR LAMINECTOMY/DECOMPRESSION MICRODISCECTOMY: SHX5026

## 2020-04-17 LAB — PROTIME-INR
INR: 1 (ref 0.8–1.2)
Prothrombin Time: 12.7 s (ref 11.4–15.2)

## 2020-04-17 SURGERY — LUMBAR LAMINECTOMY/DECOMPRESSION MICRODISCECTOMY 4 LEVEL
Anesthesia: General | Site: Spine Lumbar

## 2020-04-17 MED ORDER — ONDANSETRON HCL 4 MG/2ML IJ SOLN
4.0000 mg | Freq: Four times a day (QID) | INTRAMUSCULAR | Status: DC | PRN
  Administered 2020-04-17: 4 mg via INTRAVENOUS
  Filled 2020-04-17: qty 2

## 2020-04-17 MED ORDER — SACUBITRIL-VALSARTAN 49-51 MG PO TABS
1.0000 | ORAL_TABLET | Freq: Two times a day (BID) | ORAL | Status: DC
  Administered 2020-04-17: 1 via ORAL
  Filled 2020-04-17 (×2): qty 1

## 2020-04-17 MED ORDER — LACTATED RINGERS IV SOLN: INTRAVENOUS | Status: DC | PRN

## 2020-04-17 MED ORDER — HYDROCODONE-ACETAMINOPHEN 7.5-325 MG PO TABS: 1.0000 | ORAL_TABLET | Freq: Four times a day (QID) | ORAL | Status: DC

## 2020-04-17 MED ORDER — LIDOCAINE 2% (20 MG/ML) 5 ML SYRINGE
INTRAMUSCULAR | Status: AC
  Filled 2020-04-17: qty 5

## 2020-04-17 MED ORDER — ONDANSETRON HCL 4 MG/2ML IJ SOLN: 4.0000 mg | Freq: Four times a day (QID) | INTRAMUSCULAR | Status: DC | PRN

## 2020-04-17 MED ORDER — BUDESONIDE 0.25 MG/2ML IN SUSP
0.2500 mg | Freq: Two times a day (BID) | RESPIRATORY_TRACT | Status: DC
  Filled 2020-04-17 (×2): qty 2

## 2020-04-17 MED ORDER — DEXAMETHASONE SODIUM PHOSPHATE 10 MG/ML IJ SOLN
10.0000 mg | Freq: Once | INTRAMUSCULAR | Status: DC
  Filled 2020-04-17: qty 1

## 2020-04-17 MED ORDER — CHLORHEXIDINE GLUCONATE CLOTH 2 % EX PADS: 6.0000 | MEDICATED_PAD | Freq: Once | CUTANEOUS | Status: DC

## 2020-04-17 MED ORDER — PROPOFOL 10 MG/ML IV BOLUS
INTRAVENOUS | Status: DC | PRN
  Administered 2020-04-17: 30 mg via INTRAVENOUS
  Administered 2020-04-17: 50 mg via INTRAVENOUS
  Administered 2020-04-17: 120 mg via INTRAVENOUS

## 2020-04-17 MED ORDER — FUROSEMIDE 40 MG PO TABS: 40.0000 mg | ORAL_TABLET | Freq: Every day | ORAL | Status: DC | PRN

## 2020-04-17 MED ORDER — DEXAMETHASONE SODIUM PHOSPHATE 10 MG/ML IJ SOLN
INTRAMUSCULAR | Status: DC | PRN
  Administered 2020-04-17: 10 mg via INTRAVENOUS

## 2020-04-17 MED ORDER — LIDOCAINE 2% (20 MG/ML) 5 ML SYRINGE
INTRAMUSCULAR | Status: DC | PRN
  Administered 2020-04-17: 50 mg via INTRAVENOUS

## 2020-04-17 MED ORDER — OXYCODONE HCL 5 MG PO TABS: 5.0000 mg | ORAL_TABLET | Freq: Once | ORAL | Status: DC | PRN

## 2020-04-17 MED ORDER — SODIUM CHLORIDE 0.9% FLUSH
3.0000 mL | Freq: Two times a day (BID) | INTRAVENOUS | Status: DC
  Administered 2020-04-17: 3 mL via INTRAVENOUS

## 2020-04-17 MED ORDER — ONDANSETRON HCL 4 MG PO TABS: 4.0000 mg | ORAL_TABLET | Freq: Four times a day (QID) | ORAL | Status: DC | PRN

## 2020-04-17 MED ORDER — CELECOXIB 200 MG PO CAPS
200.0000 mg | ORAL_CAPSULE | Freq: Two times a day (BID) | ORAL | Status: DC
  Administered 2020-04-17: 200 mg via ORAL
  Filled 2020-04-17: qty 1

## 2020-04-17 MED ORDER — THROMBIN 5000 UNITS EX SOLR: OROMUCOSAL | Status: DC | PRN

## 2020-04-17 MED ORDER — ONDANSETRON HCL 4 MG/2ML IJ SOLN
INTRAMUSCULAR | Status: AC
  Filled 2020-04-17: qty 2

## 2020-04-17 MED ORDER — THROMBIN 20000 UNITS EX SOLR: CUTANEOUS | Status: DC | PRN

## 2020-04-17 MED ORDER — SUGAMMADEX SODIUM 200 MG/2ML IV SOLN
INTRAVENOUS | Status: DC | PRN
  Administered 2020-04-17: 200 mg via INTRAVENOUS

## 2020-04-17 MED ORDER — OXYCODONE-ACETAMINOPHEN 5-325 MG PO TABS
2.0000 | ORAL_TABLET | ORAL | Status: DC | PRN
  Administered 2020-04-17: 2 via ORAL
  Filled 2020-04-17: qty 2

## 2020-04-17 MED ORDER — CHLORHEXIDINE GLUCONATE 0.12 % MT SOLN
15.0000 mL | Freq: Once | OROMUCOSAL | Status: AC
  Administered 2020-04-17: 15 mL via OROMUCOSAL
  Filled 2020-04-17: qty 15

## 2020-04-17 MED ORDER — CEFAZOLIN SODIUM-DEXTROSE 2-4 GM/100ML-% IV SOLN
2.0000 g | INTRAVENOUS | Status: AC
  Administered 2020-04-17: 2 g via INTRAVENOUS
  Filled 2020-04-17: qty 100

## 2020-04-17 MED ORDER — HYDROXYZINE HCL 50 MG/ML IM SOLN: 50.0000 mg | Freq: Four times a day (QID) | INTRAMUSCULAR | Status: DC | PRN

## 2020-04-17 MED ORDER — PANTOPRAZOLE SODIUM 40 MG PO TBEC: 40.0000 mg | DELAYED_RELEASE_TABLET | Freq: Every day | ORAL | Status: DC

## 2020-04-17 MED ORDER — SODIUM CHLORIDE 0.9 % IV SOLN
250.0000 mL | INTRAVENOUS | Status: DC
  Administered 2020-04-17: 250 mL via INTRAVENOUS

## 2020-04-17 MED ORDER — SENNA 8.6 MG PO TABS: 1.0000 | ORAL_TABLET | Freq: Two times a day (BID) | ORAL | Status: DC

## 2020-04-17 MED ORDER — OXYCODONE HCL 5 MG/5ML PO SOLN: 5.0000 mg | Freq: Once | ORAL | Status: DC | PRN

## 2020-04-17 MED ORDER — PROPOFOL 10 MG/ML IV BOLUS
INTRAVENOUS | Status: AC
  Filled 2020-04-17: qty 20

## 2020-04-17 MED ORDER — THROMBIN 5000 UNITS EX SOLR
CUTANEOUS | Status: AC
  Filled 2020-04-17: qty 5000

## 2020-04-17 MED ORDER — ALBUTEROL SULFATE HFA 108 (90 BASE) MCG/ACT IN AERS: 2.0000 | INHALATION_SPRAY | Freq: Four times a day (QID) | RESPIRATORY_TRACT | Status: DC | PRN

## 2020-04-17 MED ORDER — ALBUTEROL SULFATE (2.5 MG/3ML) 0.083% IN NEBU: 2.5000 mg | INHALATION_SOLUTION | Freq: Four times a day (QID) | RESPIRATORY_TRACT | Status: DC | PRN

## 2020-04-17 MED ORDER — BUPIVACAINE HCL (PF) 0.25 % IJ SOLN
INTRAMUSCULAR | Status: AC
  Filled 2020-04-17: qty 30

## 2020-04-17 MED ORDER — METOPROLOL SUCCINATE ER 25 MG PO TB24: 25.0000 mg | ORAL_TABLET | Freq: Every day | ORAL | Status: DC

## 2020-04-17 MED ORDER — CEFAZOLIN SODIUM-DEXTROSE 2-4 GM/100ML-% IV SOLN
2.0000 g | Freq: Three times a day (TID) | INTRAVENOUS | Status: DC
  Administered 2020-04-17: 2 g via INTRAVENOUS
  Filled 2020-04-17: qty 100

## 2020-04-17 MED ORDER — ROCURONIUM BROMIDE 10 MG/ML (PF) SYRINGE
PREFILLED_SYRINGE | INTRAVENOUS | Status: DC | PRN
  Administered 2020-04-17 (×2): 20 mg via INTRAVENOUS
  Administered 2020-04-17: 50 mg via INTRAVENOUS

## 2020-04-17 MED ORDER — MORPHINE SULFATE (PF) 2 MG/ML IV SOLN: 2.0000 mg | INTRAVENOUS | Status: DC | PRN

## 2020-04-17 MED ORDER — 0.9 % SODIUM CHLORIDE (POUR BTL) OPTIME
TOPICAL | Status: DC | PRN
  Administered 2020-04-17: 1000 mL

## 2020-04-17 MED ORDER — FENTANYL CITRATE (PF) 250 MCG/5ML IJ SOLN
INTRAMUSCULAR | Status: AC
  Filled 2020-04-17: qty 5

## 2020-04-17 MED ORDER — MENTHOL 3 MG MT LOZG: 1.0000 | LOZENGE | OROMUCOSAL | Status: DC | PRN

## 2020-04-17 MED ORDER — PROPOFOL 500 MG/50ML IV EMUL
INTRAVENOUS | Status: DC | PRN
  Administered 2020-04-17: 100 ug/kg/min via INTRAVENOUS

## 2020-04-17 MED ORDER — ORAL CARE MOUTH RINSE: 15.0000 mL | Freq: Once | OROMUCOSAL | Status: AC

## 2020-04-17 MED ORDER — FENTANYL CITRATE (PF) 100 MCG/2ML IJ SOLN
INTRAMUSCULAR | Status: AC
  Filled 2020-04-17: qty 2

## 2020-04-17 MED ORDER — METHOCARBAMOL 1000 MG/10ML IJ SOLN
500.0000 mg | Freq: Four times a day (QID) | INTRAVENOUS | Status: DC | PRN
  Filled 2020-04-17: qty 5

## 2020-04-17 MED ORDER — FENTANYL CITRATE (PF) 250 MCG/5ML IJ SOLN
INTRAMUSCULAR | Status: DC | PRN
  Administered 2020-04-17 (×7): 50 ug via INTRAVENOUS

## 2020-04-17 MED ORDER — SODIUM CHLORIDE 0.9 % IV SOLN: INTRAVENOUS | Status: DC | PRN

## 2020-04-17 MED ORDER — SODIUM CHLORIDE 0.9% FLUSH: 3.0000 mL | INTRAVENOUS | Status: DC | PRN

## 2020-04-17 MED ORDER — ONDANSETRON HCL 4 MG/2ML IJ SOLN
INTRAMUSCULAR | Status: DC | PRN
  Administered 2020-04-17: 4 mg via INTRAVENOUS

## 2020-04-17 MED ORDER — PHENOL 1.4 % MT LIQD: 1.0000 | OROMUCOSAL | Status: DC | PRN

## 2020-04-17 MED ORDER — ROCURONIUM BROMIDE 10 MG/ML (PF) SYRINGE
PREFILLED_SYRINGE | INTRAVENOUS | Status: AC
  Filled 2020-04-17: qty 10

## 2020-04-17 MED ORDER — METHOCARBAMOL 500 MG PO TABS: 500.0000 mg | ORAL_TABLET | Freq: Four times a day (QID) | ORAL | Status: DC | PRN

## 2020-04-17 MED ORDER — DEXAMETHASONE SODIUM PHOSPHATE 10 MG/ML IJ SOLN
INTRAMUSCULAR | Status: AC
  Filled 2020-04-17: qty 1

## 2020-04-17 MED ORDER — ACETAMINOPHEN 650 MG RE SUPP: 650.0000 mg | RECTAL | Status: DC | PRN

## 2020-04-17 MED ORDER — ACETAMINOPHEN 325 MG PO TABS: 650.0000 mg | ORAL_TABLET | ORAL | Status: DC | PRN

## 2020-04-17 MED ORDER — PHENYLEPHRINE HCL-NACL 10-0.9 MG/250ML-% IV SOLN
INTRAVENOUS | Status: DC | PRN
Start: 2020-04-17 — End: 2020-04-17
  Administered 2020-04-17: 20 ug/min via INTRAVENOUS

## 2020-04-17 MED ORDER — THROMBIN 20000 UNITS EX SOLR
CUTANEOUS | Status: AC
  Filled 2020-04-17: qty 20000

## 2020-04-17 MED ORDER — BUPIVACAINE HCL (PF) 0.25 % IJ SOLN
INTRAMUSCULAR | Status: DC | PRN
  Administered 2020-04-17: 10 mL
  Administered 2020-04-17: 6 mL

## 2020-04-17 MED ORDER — MIDAZOLAM HCL 2 MG/2ML IJ SOLN
INTRAMUSCULAR | Status: AC
  Filled 2020-04-17: qty 2

## 2020-04-17 MED ORDER — MIDAZOLAM HCL 5 MG/5ML IJ SOLN
INTRAMUSCULAR | Status: DC | PRN
  Administered 2020-04-17: 2 mg via INTRAVENOUS

## 2020-04-17 MED ORDER — POTASSIUM CHLORIDE IN NACL 20-0.9 MEQ/L-% IV SOLN: INTRAVENOUS | Status: DC

## 2020-04-17 MED ORDER — OXYCODONE-ACETAMINOPHEN 5-325 MG PO TABS: 1.0000 | ORAL_TABLET | ORAL | 0 refills | Status: AC | PRN

## 2020-04-17 MED ORDER — FENTANYL CITRATE (PF) 100 MCG/2ML IJ SOLN
25.0000 ug | INTRAMUSCULAR | Status: DC | PRN
  Administered 2020-04-17: 25 ug via INTRAVENOUS

## 2020-04-17 SURGICAL SUPPLY — 44 items
BAG DECANTER FOR FLEXI CONT (MISCELLANEOUS) ×3 IMPLANT
BAND RUBBER #18 3X1/16 STRL (MISCELLANEOUS) IMPLANT
BENZOIN TINCTURE PRP APPL 2/3 (GAUZE/BANDAGES/DRESSINGS) ×3 IMPLANT
BUR CARBIDE MATCH 3.0 (BURR) ×3 IMPLANT
CANISTER SUCT 3000ML PPV (MISCELLANEOUS) ×3 IMPLANT
CLOSURE WOUND 1/2 X4 (GAUZE/BANDAGES/DRESSINGS) ×1
DRAPE LAPAROTOMY 100X72X124 (DRAPES) ×3 IMPLANT
DRAPE MICROSCOPE LEICA (MISCELLANEOUS) IMPLANT
DRAPE SURG 17X23 STRL (DRAPES) ×3 IMPLANT
DRSG OPSITE 4X5.5 SM (GAUZE/BANDAGES/DRESSINGS) ×3 IMPLANT
DRSG OPSITE POSTOP 4X8 (GAUZE/BANDAGES/DRESSINGS) ×3 IMPLANT
DURAPREP 26ML APPLICATOR (WOUND CARE) ×3 IMPLANT
ELECT REM PT RETURN 9FT ADLT (ELECTROSURGICAL) ×3
ELECTRODE REM PT RTRN 9FT ADLT (ELECTROSURGICAL) ×1 IMPLANT
GAUZE 4X4 16PLY RFD (DISPOSABLE) IMPLANT
GLOVE BIO SURGEON STRL SZ 6.5 (GLOVE) ×4 IMPLANT
GLOVE BIO SURGEON STRL SZ7 (GLOVE) ×9 IMPLANT
GLOVE BIO SURGEON STRL SZ8 (GLOVE) ×3 IMPLANT
GLOVE BIO SURGEONS STRL SZ 6.5 (GLOVE) ×2
GLOVE BIOGEL PI IND STRL 7.0 (GLOVE) ×3 IMPLANT
GLOVE BIOGEL PI IND STRL 7.5 (GLOVE) ×2 IMPLANT
GLOVE BIOGEL PI INDICATOR 7.0 (GLOVE) ×6
GLOVE BIOGEL PI INDICATOR 7.5 (GLOVE) ×4
GOWN STRL REUS W/ TWL LRG LVL3 (GOWN DISPOSABLE) ×1 IMPLANT
GOWN STRL REUS W/ TWL XL LVL3 (GOWN DISPOSABLE) ×2 IMPLANT
GOWN STRL REUS W/TWL 2XL LVL3 (GOWN DISPOSABLE) IMPLANT
GOWN STRL REUS W/TWL LRG LVL3 (GOWN DISPOSABLE) ×2
GOWN STRL REUS W/TWL XL LVL3 (GOWN DISPOSABLE) ×4
HEMOSTAT POWDER KIT SURGIFOAM (HEMOSTASIS) ×6 IMPLANT
KIT BASIN OR (CUSTOM PROCEDURE TRAY) ×3 IMPLANT
KIT TURNOVER KIT B (KITS) ×3 IMPLANT
NEEDLE HYPO 25X1 1.5 SAFETY (NEEDLE) ×3 IMPLANT
NEEDLE SPNL 20GX3.5 QUINCKE YW (NEEDLE) ×3 IMPLANT
NS IRRIG 1000ML POUR BTL (IV SOLUTION) ×3 IMPLANT
PACK LAMINECTOMY NEURO (CUSTOM PROCEDURE TRAY) ×3 IMPLANT
SPONGE SURGIFOAM ABS GEL 100 (HEMOSTASIS) ×3 IMPLANT
STRIP CLOSURE SKIN 1/2X4 (GAUZE/BANDAGES/DRESSINGS) ×2 IMPLANT
SUT VIC AB 0 CT1 18XCR BRD8 (SUTURE) ×1 IMPLANT
SUT VIC AB 0 CT1 8-18 (SUTURE) ×2
SUT VIC AB 2-0 CP2 18 (SUTURE) ×3 IMPLANT
SUT VIC AB 3-0 SH 8-18 (SUTURE) ×3 IMPLANT
TOWEL GREEN STERILE (TOWEL DISPOSABLE) ×3 IMPLANT
TOWEL GREEN STERILE FF (TOWEL DISPOSABLE) ×3 IMPLANT
WATER STERILE IRR 1000ML POUR (IV SOLUTION) ×3 IMPLANT

## 2020-04-17 NOTE — Anesthesia Procedure Notes (Signed)
Procedure Name: Intubation Date/Time: 04/17/2020 7:55 AM Performed by: Pearson Grippe, CRNA Pre-anesthesia Checklist: Patient identified, Emergency Drugs available, Suction available and Patient being monitored Patient Re-evaluated:Patient Re-evaluated prior to induction Oxygen Delivery Method: Circle system utilized Preoxygenation: Pre-oxygenation with 100% oxygen Induction Type: IV induction Ventilation: Mask ventilation without difficulty and Oral airway inserted - appropriate to patient size Laryngoscope Size: Hyacinth Meeker and 2 Grade View: Grade I Tube type: Oral Tube size: 7.5 mm Number of attempts: 1 Airway Equipment and Method: Stylet and Oral airway Placement Confirmation: ETT inserted through vocal cords under direct vision,  positive ETCO2 and breath sounds checked- equal and bilateral Secured at: 22 cm Tube secured with: Tape Dental Injury: Teeth and Oropharynx as per pre-operative assessment

## 2020-04-17 NOTE — Plan of Care (Signed)
Patient alert and oriented, mae's well, voiding adequate amount of urine, swallowing without difficulty, no c/o pain at time of discharge. Patient discharged home with family. Script and discharged instructions given to patient. Patient and family stated understanding of instructions given. Patient has an appointment with Dr. jones   

## 2020-04-17 NOTE — Op Note (Signed)
04/17/2020  10:56 AM  PATIENT:  Brent Nichols  69 y.o. male  PRE-OPERATIVE DIAGNOSIS: Lumbar spinal stenosis L1-2, L2 -3, L3-4, L4-5  POST-OPERATIVE DIAGNOSIS:  same  PROCEDURE: Decompressive lumbar laminectomy, medial facetectomy and foraminotomies L1-2, L2-3, L3-4, L4-5  SURGEON:  Marikay Alar, MD  ASSISTANTS: Verlin Dike, FNP  ANESTHESIA:   General  EBL: 450 ml  Total I/O In: 1800 [I.V.:1800] Out: 450 [Blood:450]  BLOOD ADMINISTERED: none  DRAINS: Medium Hemovac  SPECIMEN:  none  INDICATION FOR PROCEDURE: This patient presented with back and left leg pain especially with ambulation. Imaging showed spinal stenosis of the lumbar spine. The patient tried conservative measures without relief. Pain was debilitating. Recommended decompressive laminectomy L1-L4. Patient understood the risks, benefits, and alternatives and potential outcomes and wished to proceed.  PROCEDURE DETAILS: The patient was taken to the operating room and after induction of adequate generalized endotracheal anesthesia, the patient was rolled into the prone position on the Wilson frame and all pressure points were padded. The lumbar region was cleaned and then prepped with DuraPrep and draped in the usual sterile fashion. 5 cc of local anesthesia was injected and then a dorsal midline incision was made and carried down to the lumbo sacral fascia. The fascia was opened and the paraspinous musculature was taken down in a subperiosteal fashion to expose L1-L4 bilaterally. Intraoperative x-ray confirmed my level, and then I moved the spinous processes of L1, L2, L3 and L4 used a combination of the high-speed drill and the Kerrison punches to perform a hemilaminectomy, medial facetectomy, and foraminotomy at L1-2, L2-3, L3-4, and L4-5 bilaterally . The underlying yellow ligament was opened and removed in a piecemeal fashion to expose the underlying dura and exiting nerve root at each level. I undercut the lateral recess  at each level and dissected down until I was medial to and distal to the pedicle. The nerve root was well decompressed at each level bilaterally. We then gently retracted the nerve root medially with a retractor, coagulated the epidural venous vasculature, and affected the disc spaces on the right-hand side.  Found no significant disc herniation.   I then palpated with a coronary dilator along the nerve root and into the foramen to assure adequate decompression. I felt no more compression of the nerve roots. I irrigated with saline solution containing bacitracin. Achieved hemostasis with bipolar cautery and Surgifoam, lined the dura with Gelfoam, placed a medium Hemovac drain through a separate stab incision, and then closed the fascia with 0 Vicryl. I closed the subcutaneous tissues with 2-0 Vicryl and the subcuticular tissues with 3-0 Vicryl. The skin was then closed with benzoin and Steri-Strips. The drapes were removed, a sterile dressing was applied.  My nurse practitioner was involved in the exposure, safe retraction of the neural elements, the disc work and the closure. the patient was awakened from general anesthesia and transferred to the recovery room in stable condition. At the end of the procedure all sponge, needle and instrument counts were correct.    PLAN OF CARE: Admit to inpatient   PATIENT DISPOSITION:  PACU - hemodynamically stable.   Delay start of Pharmacological VTE agent (>24hrs) due to surgical blood loss or risk of bleeding:  yes

## 2020-04-17 NOTE — Evaluation (Signed)
Physical Therapy Evaluation and Discharge Patient Details Name: Brent Nichols MRN: [id number] DOB: [birth year] Today's Date: 04/17/2020   History of Present Illness  Pt is a 68 y/o male s/p L1-5 laminectomy. PMH includes COPD, CHF, HTN, and CVA.   Clinical Impression  Patient evaluated by Physical Therapy with no further acute PT needs identified. All education has been completed and the patient has no further questions. Pt guarded, however, overall steady during mobility tasks. Overall requiring min guard to supervision for safety. Educated about back precautions, generalized walking program, and how to perform ADLs while maintaining precautions.  See below for any follow-up Physical Therapy or equipment needs. PT is signing off. Thank you for this referral. If needs change, please re-consult.     Follow Up Recommendations No PT follow up;Supervision for mobility/OOB    Equipment Recommendations  None recommended by PT    Recommendations for Other Services       Precautions / Restrictions Precautions Precautions: Back Precaution Booklet Issued: Yes (comment) Precaution Comments: Reviewed back precautions and how to maintain during ADLs.  Restrictions Weight Bearing Restrictions: No      Mobility  Bed Mobility Overal bed mobility: Needs Assistance Bed Mobility: Rolling;Sidelying to Sit;Sit to Sidelying Rolling: Min guard Sidelying to sit: Min guard     Sit to sidelying: Min guard General bed mobility comments: min guard to ensure log roll technique. Otherwise, no physical assist required.   Transfers Overall transfer level: Needs assistance Equipment used: Straight cane Transfers: Sit to/from Stand Sit to Stand: Supervision         General transfer comment: Supervision for safety.   Ambulation/Gait Ambulation/Gait assistance: Supervision Gait Distance (Feet): 150 Feet Assistive device: Straight cane Gait Pattern/deviations: Step-through pattern;Decreased stride  length Gait velocity: Decreased   General Gait Details: Guarded gait, however, overall steady. Educated about using RW for comfort and about performing walking program at home.   Stairs Stairs: Yes Stairs assistance: Min guard Stair Management: One rail Right;Forwards;With cane;Step to pattern Number of Stairs: 2 General stair comments: Practiced stair navigation using cane and rail. Overall steady. Min guard for safety; no physical assist required.   Wheelchair Mobility    Modified Rankin (Stroke Patients Only)       Balance Overall balance assessment: Mild deficits observed, not formally tested                                           Pertinent Vitals/Pain Pain Assessment: Faces Faces Pain Scale: Hurts little more Pain Location: back Pain Descriptors / Indicators: Aching Pain Intervention(s): Limited activity within patient's tolerance;Monitored during session;Repositioned    Home Living Family/patient expects to be discharged to:: Private residence Living Arrangements: Spouse/significant other Available Help at Discharge: Family Type of Home: House Home Access: Stairs to enter   Technical brewer of Steps: 2 Home Layout: One level Home Equipment: Environmental consultant - 2 wheels;Cane - single point      Prior Function Level of Independence: Independent with assistive device(s)         Comments: Has been using a cane for ambulation.      Hand Dominance        Extremity/Trunk Assessment   Upper Extremity Assessment Upper Extremity Assessment: Overall WFL for tasks assessed    Lower Extremity Assessment Lower Extremity Assessment: LLE deficits/detail LLE Deficits / Details: reports LLE pain has improved.     Cervical /  Trunk Assessment Cervical / Trunk Assessment: Other exceptions Cervical / Trunk Exceptions: s/p lumbar surgery   Communication   Communication: No difficulties  Cognition Arousal/Alertness: Awake/alert Behavior During  Therapy: WFL for tasks assessed/performed Overall Cognitive Status: Within Functional Limits for tasks assessed                                        General Comments General comments (skin integrity, edema, etc.): Educated about performing car transfer technique and maintaining precautions while performing ADL tasks.     Exercises     Assessment/Plan    PT Assessment Patent does not need any further PT services  PT Problem List         PT Treatment Interventions      PT Goals (Current goals can be found in the Care Plan section)  Acute Rehab PT Goals Patient Stated Goal: to go home PT Goal Formulation: With patient Time For Goal Achievement: 04/17/20 Potential to Achieve Goals: Good    Frequency     Barriers to discharge        Co-evaluation               AM-PAC PT "6 Clicks" Mobility  Outcome Measure Help needed turning from your back to your side while in a flat bed without using bedrails?: A Little Help needed moving from lying on your back to sitting on the side of a flat bed without using bedrails?: A Little Help needed moving to and from a bed to a chair (including a wheelchair)?: None Help needed standing up from a chair using your arms (e.g., wheelchair or bedside chair)?: None Help needed to walk in hospital room?: None Help needed climbing 3-5 steps with a railing? : A Little 6 Click Score: 21    End of Session Equipment Utilized During Treatment: Gait belt Activity Tolerance: Patient limited by pain Patient left: in bed;with call bell/phone within reach;with family/visitor present Nurse Communication: Mobility status PT Visit Diagnosis: Other abnormalities of gait and mobility (R26.89)    Time: 6440-3474 PT Time Calculation (min) (ACUTE ONLY): 27 min   Charges:   PT Evaluation $PT Eval Low Complexity: 1 Low PT Treatments $Gait Training: 8-22 mins        Cindee Salt, DPT  Acute Rehabilitation Services  Pager:  [phone] Office: [phone]   Brent Nichols 04/17/2020, 3:26 PM

## 2020-04-17 NOTE — Transfer of Care (Signed)
Immediate Anesthesia Transfer of Care Note  Patient: Brent Nichols  Procedure(s) Performed: Lumbar One-Two, Lumbar Two-Three, Lumbar Three-Four, Lumbar Four-FIve Laminectomy and Foraminotomy (N/A Spine Lumbar)  Patient Location: PACU  Anesthesia Type:General  Level of Consciousness: awake, alert  and oriented  Airway & Oxygen Therapy: Patient Spontanous Breathing and Patient connected to face mask oxygen  Post-op Assessment: Report given to RN and Post -op Vital signs reviewed and stable  Post vital signs: Reviewed and stable  Last Vitals:  Vitals Value Taken Time  BP 115/75 04/17/20 1056  Temp    Pulse 64 04/17/20 1058  Resp 13 04/17/20 1058  SpO2 97 % 04/17/20 1058  Vitals shown include unvalidated device data.  Last Pain:  Vitals:   04/17/20 0640  TempSrc:   PainSc: 3          Complications: No complications documented.

## 2020-04-17 NOTE — Anesthesia Postprocedure Evaluation (Signed)
Anesthesia Post Note  Patient: Brent Nichols  Procedure(s) Performed: Lumbar One-Two, Lumbar Two-Three, Lumbar Three-Four, Lumbar Four-FIve Laminectomy and Foraminotomy (N/A Spine Lumbar)     Patient location during evaluation: PACU Anesthesia Type: General Level of consciousness: awake and alert Pain management: pain level controlled Vital Signs Assessment: post-procedure vital signs reviewed and stable Respiratory status: spontaneous breathing, nonlabored ventilation, respiratory function stable and patient connected to nasal cannula oxygen Cardiovascular status: blood pressure returned to baseline and stable Postop Assessment: no apparent nausea or vomiting Anesthetic complications: no   No complications documented.  Last Vitals:  Vitals:   04/17/20 1212 04/17/20 1600  BP: 128/88 (!) 116/54  Pulse: 70 78  Resp: 18 18  Temp: 37.2 C 36.6 C  SpO2: 99% 98%    Last Pain:  Vitals:   04/17/20 1600  TempSrc: Oral  PainSc:                  Easten Maceachern S

## 2020-04-17 NOTE — Discharge Summary (Signed)
Physician Discharge Summary  Patient ID: Brent Nichols MRN: [id number] DOB/AGE: 68-Dec-1952 68 y.o.  Admit date: 04/17/2020 Discharge date: 04/17/2020  Admission Diagnoses: lumbar stenosis    Discharge Diagnoses: same   Discharged Condition: good  Hospital Course: The patient was admitted on 04/17/2020 and taken to the operating room where the patient underwent LL L1-4. The patient tolerated the procedure well and was taken to the recovery room and then to the floor in stable condition. The hospital course was routine. There were no complications. The wound remained clean dry and intact. Pt had appropriate back soreness. No complaints of leg pain or new N/T/W. The patient remained afebrile with stable vital signs, and tolerated a regular diet. The patient continued to increase activities, and pain was well controlled with oral pain medications.   Consults: None  Significant Diagnostic Studies:  Results for orders placed or performed during the hospital encounter of 04/17/20  Protime-INR  Result Value Ref Range   Prothrombin Time 12.7 11.4 - 15.2 seconds   INR 1.0 0.8 - 1.2    No results found.  Antibiotics:  Anti-infectives (From admission, onward)   Start     Dose/Rate Route Frequency Ordered Stop   04/17/20 1600  ceFAZolin (ANCEF) IVPB 2g/100 mL premix     Discontinue     2 g 200 mL/hr over 30 Minutes Intravenous Every 8 hours 04/17/20 1201 04/18/20 0759   04/17/20 0855  bacitracin 50,000 Units in sodium chloride 0.9 % 500 mL irrigation  Status:  Discontinued          As needed 04/17/20 0855 04/17/20 1052   04/17/20 0630  ceFAZolin (ANCEF) IVPB 2g/100 mL premix        2 g 200 mL/hr over 30 Minutes Intravenous On call to O.R. 04/17/20 7425 04/17/20 0845      Discharge Exam: Blood pressure 128/88, pulse 70, temperature 99 F (37.2 C), temperature source Oral, resp. rate 18, height 5\' 9"  (1.753 m), weight 94.8 kg, SpO2 99 %. Neurologic: Grossly normal Dressing  dry  Discharge Medications:   Allergies as of 04/17/2020      Reactions   Codeine Nausea And Vomiting      Medication List    TAKE these medications   albuterol (2.5 MG/3ML) 0.083% nebulizer solution Commonly known as: PROVENTIL Inhale into the lungs.   albuterol 108 (90 Base) MCG/ACT inhaler Commonly known as: VENTOLIN HFA Inhale 2 puffs into the lungs every 6 (six) hours as needed for wheezing or shortness of breath.   clopidogrel 75 MG tablet Commonly known as: PLAVIX Take 75 mg by mouth daily.   Entresto 49-51 MG Generic drug: sacubitril-valsartan Take 1 tablet by mouth 2 (two) times daily.   fluticasone 110 MCG/ACT inhaler Commonly known as: FLOVENT HFA Inhale 1 puff into the lungs 2 (two) times daily.   fluticasone 50 MCG/ACT nasal spray Commonly known as: FLONASE Place 1 spray into both nostrils in the morning and at bedtime.   furosemide 40 MG tablet Commonly known as: LASIX Take 40 mg by mouth daily as needed for fluid.   meclizine 25 MG tablet Commonly known as: ANTIVERT Take 25 mg by mouth 3 (three) times daily as needed for dizziness.   metoprolol succinate 25 MG 24 hr tablet Commonly known as: TOPROL-XL Take 25 mg by mouth daily.   NexIUM 40 MG capsule Generic drug: esomeprazole Take 40 mg by mouth daily.   ondansetron 4 MG tablet Commonly known as: ZOFRAN Take 1 tablet (4 mg total)  by mouth every 6 (six) hours. What changed:   when to take this  reasons to take this   oxyCODONE-acetaminophen 5-325 MG tablet Commonly known as: PERCOCET/ROXICET Take 1-2 tablets by mouth every 4 (four) hours as needed for severe pain. What changed: how much to take   rosuvastatin 20 MG tablet Commonly known as: CRESTOR Take 20 mg by mouth daily.   warfarin 3 MG tablet Commonly known as: COUMADIN Take 4.5 mg by mouth daily.       Disposition: home   Final Dx: LL for stenosis  Discharge Instructions     Remove dressing in 72 hours   Complete by:  As directed    Call MD for:  difficulty breathing, headache or visual disturbances   Complete by: As directed    Call MD for:  persistant nausea and vomiting   Complete by: As directed    Call MD for:  redness, tenderness, or signs of infection (pain, swelling, redness, odor or green/yellow discharge around incision site)   Complete by: As directed    Call MD for:  severe uncontrolled pain   Complete by: As directed    Call MD for:  temperature >100.4   Complete by: As directed    Diet - low sodium heart healthy   Complete by: As directed    Discharge instructions   Complete by: As directed    Resume plavix and or anticoagulant thursday night   Increase activity slowly   Complete by: As directed        Follow-up Information    Tia Alert, MD. Schedule an appointment as soon as possible for a visit in 2 week(s).   Specialty: Neurosurgery Contact information: [address] [place] [address] [phone]                Signed: Tia Alert 04/17/2020, 12:30 PM

## 2020-04-17 NOTE — Progress Notes (Signed)
Dr. Phoebe Perch requesting [employer] Rep to come place pacemaker on asynchronous mode.[employer] rep, Vernona Rieger will be here in 5-10 minutes.

## 2020-04-17 NOTE — H&P (Signed)
Subjective: Patient is a 69 y.o. male admitted for lumbar stenosis. Onset of symptoms was several months ago, gradually worsening since that time.  The pain is rated severe, and is located at the across the lower back and radiates to LLE. The pain is described as aching and occurs all day. The symptoms have been progressive. Symptoms are exacerbated by exercise. MRI or CT showed severe stenosis   Past Medical History:  Diagnosis Date  . Arthritis   . Asthma   . CHF (congestive heart failure) ([place])   . Chronic bilateral low back pain   . Colitis    history of  . COPD (chronic obstructive pulmonary disease) ([place])   . Dyspnea    exerting self  . GERD (gastroesophageal reflux disease)   . Heart murmur   . Hyperlipidemia   . Hypertension   . Lupus ([place])   . PONV (postoperative nausea and vomiting)   . Presence of permanent cardiac pacemaker   . Sleep apnea   . Stroke [facility])    6 yrs. ago    Past Surgical History:  Procedure Laterality Date  . AORTIC VALVE REPLACEMENT     St Jude mechanical ~ 2005  . BACK SURGERY    . CARDIAC CATHETERIZATION     ~ 2004 "nonobstructive" CAD  . CHOLECYSTECTOMY    . HERNIA REPAIR    . INSERT / REPLACE / REMOVE PACEMAKER     St. Jude, generator change 04/06/18  . respicardia Right 2019   phrenic nerve stimulator for treatment of central sleep apnea    Prior to Admission medications   Medication Sig Start Date End Date Taking? Authorizing Provider  albuterol (VENTOLIN HFA) 108 (90 Base) MCG/ACT inhaler Inhale 2 puffs into the lungs every 6 (six) hours as needed for wheezing or shortness of breath.   Yes [provider]  clopidogrel (PLAVIX) 75 MG tablet Take 75 mg by mouth daily.  04/04/20  Yes [provider]  esomeprazole (NEXIUM) 40 MG capsule Take 40 mg by mouth daily.  10/22/19  Yes [provider]  fluticasone (FLONASE) 50 MCG/ACT nasal spray Place 1 spray into both nostrils in the morning and at bedtime.   Yes [provider]  fluticasone (FLOVENT HFA) 110 MCG/ACT inhaler Inhale 1 puff into the lungs 2 (two) times daily.   Yes [provider]  furosemide (LASIX) 40 MG tablet Take 40 mg by mouth daily as needed for fluid.  01/05/13  Yes [provider]  metoprolol succinate (TOPROL-XL) 25 MG 24 hr tablet Take 25 mg by mouth daily.  01/18/20  Yes [provider]  ondansetron (ZOFRAN) 4 MG tablet Take 1 tablet (4 mg total) by mouth every 6 (six) hours. Patient taking differently: Take 4 mg by mouth every 6 (six) hours as needed for nausea.  04/06/20  Yes Garald Balding, PA-C  oxyCODONE-acetaminophen (PERCOCET/ROXICET) 5-325 MG tablet Take 2 tablets by mouth every 4 (four) hours as needed for severe pain. 04/06/20  Yes Garald Balding, PA-C  rosuvastatin (CRESTOR) 20 MG tablet Take 20 mg by mouth daily.    Yes [provider]  sacubitril-valsartan (ENTRESTO) 49-51 MG Take 1 tablet by mouth 2 (two) times daily.   Yes [provider]  warfarin (COUMADIN) 3 MG tablet Take 4.5 mg by mouth daily.    Yes [provider]  albuterol (PROVENTIL) (2.5 MG/3ML) 0.083% nebulizer solution Inhale into the lungs.    [provider]  meclizine (ANTIVERT) 25 MG  tablet Take 25 mg by mouth 3 (three) times daily as needed for dizziness.    [provider]   Allergies  Allergen Reactions  . Codeine Nausea And Vomiting    Social History   Tobacco Use  . Smoking status: Never Smoker  . Smokeless tobacco: Never Used  Substance Use Topics  . Alcohol use: Not Currently    History reviewed. No pertinent family history.   Review of Systems  Positive ROS: neg  All other systems have been reviewed and were otherwise negative with the exception of those mentioned in the HPI and as above.  Objective: Vital signs in last 24 hours: Temp:  [97.3 F (36.3 C)] 97.3 F (36.3 C) (06/14 0621) Pulse Rate:  [64] 64 (06/14 0621) Resp:  [17] 17 (06/14 0621) BP:  (118)/(68) 118/68 (06/14 0621) SpO2:  [100 %] 100 % (06/14 0621) Weight:  [94.8 kg] 94.8 kg (06/14 0621)  General Appearance: Alert, cooperative, no distress, appears stated age Head: Normocephalic, without obvious abnormality, atraumatic Eyes: PERRL, conjunctiva/corneas clear, EOM's intact    Neck: Supple, symmetrical, trachea midline Back: Symmetric, no curvature, ROM normal, no CVA tenderness Lungs:  respirations unlabored Heart: Regular rate and rhythm Abdomen: Soft, non-tender Extremities: Extremities normal, atraumatic, no cyanosis or edema Pulses: 2+ and symmetric all extremities Skin: Skin color, texture, turgor normal, no rashes or lesions  NEUROLOGIC:   Mental status: Alert and oriented x4,  no aphasia, good attention span, fund of knowledge, and memory Motor Exam - grossly normal Sensory Exam - grossly normal Reflexes: trace Coordination - grossly normal Gait - not tested Balance - not tested Cranial Nerves: I: smell Not tested  II: visual acuity  OS: nl    OD: nl  II: visual fields Full to confrontation  II: pupils Equal, round, reactive to light  III,VII: ptosis None  III,IV,VI: extraocular muscles  Full ROM  V: mastication Normal  V: facial light touch sensation  Normal  V,VII: corneal reflex  Present  VII: facial muscle function - upper  Normal  VII: facial muscle function - lower Normal  VIII: hearing Not tested  IX: soft palate elevation  Normal  IX,X: gag reflex Present  XI: trapezius strength  5/5  XI: sternocleidomastoid strength 5/5  XI: neck flexion strength  5/5  XII: tongue strength  Normal    Data Review Lab Results  Component Value Date   WBC 6.7 04/13/2020   HGB 14.4 04/13/2020   HCT 43.3 04/13/2020   MCV 96.2 04/13/2020   PLT 191 04/13/2020   Lab Results  Component Value Date   NA 138 04/13/2020   K 3.9 04/13/2020   CL 109 04/13/2020   CO2 19 (L) 04/13/2020   BUN 19 04/13/2020   CREATININE 0.88 04/13/2020   GLUCOSE 109 (H)  04/13/2020   Lab Results  Component Value Date   INR 1.0 04/17/2020    Assessment/Plan:  Estimated body mass index is 30.86 kg/m as calculated from the following:   Height as of this encounter: 5\' 9"  (1.753 m).   Weight as of this encounter: 94.8 kg. Patient admitted for LL L1-4 for stenosis. Patient has failed a reasonable attempt at conservative therapy.  I explained the condition and procedure to the patient and answered any questions.  Patient wishes to proceed with procedure as planned. Understands risks/ benefits and typical outcomes of procedure.   04/17/2020 7:23 AM

## 2020-04-18 ENCOUNTER — Encounter (HOSPITAL_COMMUNITY): Payer: Self-pay | Admitting: Neurological Surgery

## 2020-08-29 ENCOUNTER — Other Ambulatory Visit: Payer: Self-pay | Admitting: Student

## 2020-08-29 DIAGNOSIS — R29898 Other symptoms and signs involving the musculoskeletal system: Secondary | ICD-10-CM

## 2020-08-30 ENCOUNTER — Telehealth: Payer: Self-pay | Admitting: Nurse Practitioner

## 2020-08-30 NOTE — Telephone Encounter (Signed)
Phone call to patient to verify medication list and allergies for myelogram procedure, spoke with wife. Pt aware he will not need to hold any medications for this procedure. Pt's wife verbalized understanding. Pre and post procedure instructions reviewed with wife.

## 2020-09-04 ENCOUNTER — Ambulatory Visit
Admission: RE | Admit: 2020-09-04 | Discharge: 2020-09-04 | Disposition: A | Discharge status: Home / Self Care | Payer: Medicare Other | Source: Ambulatory Visit | Attending: Student | Admitting: Student

## 2020-09-04 ENCOUNTER — Other Ambulatory Visit: Payer: Self-pay

## 2020-09-04 VITALS — BP 180/88 | HR 64

## 2020-09-04 DIAGNOSIS — R29898 Other symptoms and signs involving the musculoskeletal system: Secondary | ICD-10-CM

## 2020-09-04 DIAGNOSIS — Z9889 Other specified postprocedural states: Secondary | ICD-10-CM

## 2020-09-04 MED ORDER — DIAZEPAM 5 MG PO TABS: 5.0000 mg | ORAL_TABLET | Freq: Once | ORAL | Status: DC

## 2020-09-04 MED ORDER — IOPAMIDOL (ISOVUE-M 200) INJECTION 41%
20.0000 mL | Freq: Once | INTRAMUSCULAR | Status: AC
  Administered 2020-09-04: 20 mL via INTRATHECAL

## 2020-09-04 NOTE — Discharge Instructions (Signed)

## 2020-09-26 ENCOUNTER — Other Ambulatory Visit: Payer: Self-pay | Admitting: Orthopedic Surgery

## 2020-09-26 ENCOUNTER — Other Ambulatory Visit: Payer: Self-pay | Admitting: Physician Assistant

## 2020-09-26 DIAGNOSIS — M25552 Pain in left hip: Secondary | ICD-10-CM

## 2020-10-16 ENCOUNTER — Other Ambulatory Visit: Payer: Medicare Other

## 2020-10-17 ENCOUNTER — Other Ambulatory Visit: Payer: Medicare Other

## 2020-11-04 DEATH — deceased

## 2021-04-25 IMAGING — CR DG LUMBAR SPINE 1V
1 series · 1 of 1 positions shown · non-contrast
Comparison: Plain films lumbar spine 01/18/2020.

CLINICAL DATA: Intraoperative localization film for patient
undergoing lumbar surgery.

EXAM:
LUMBAR SPINE - 1 VIEW

[lateral]
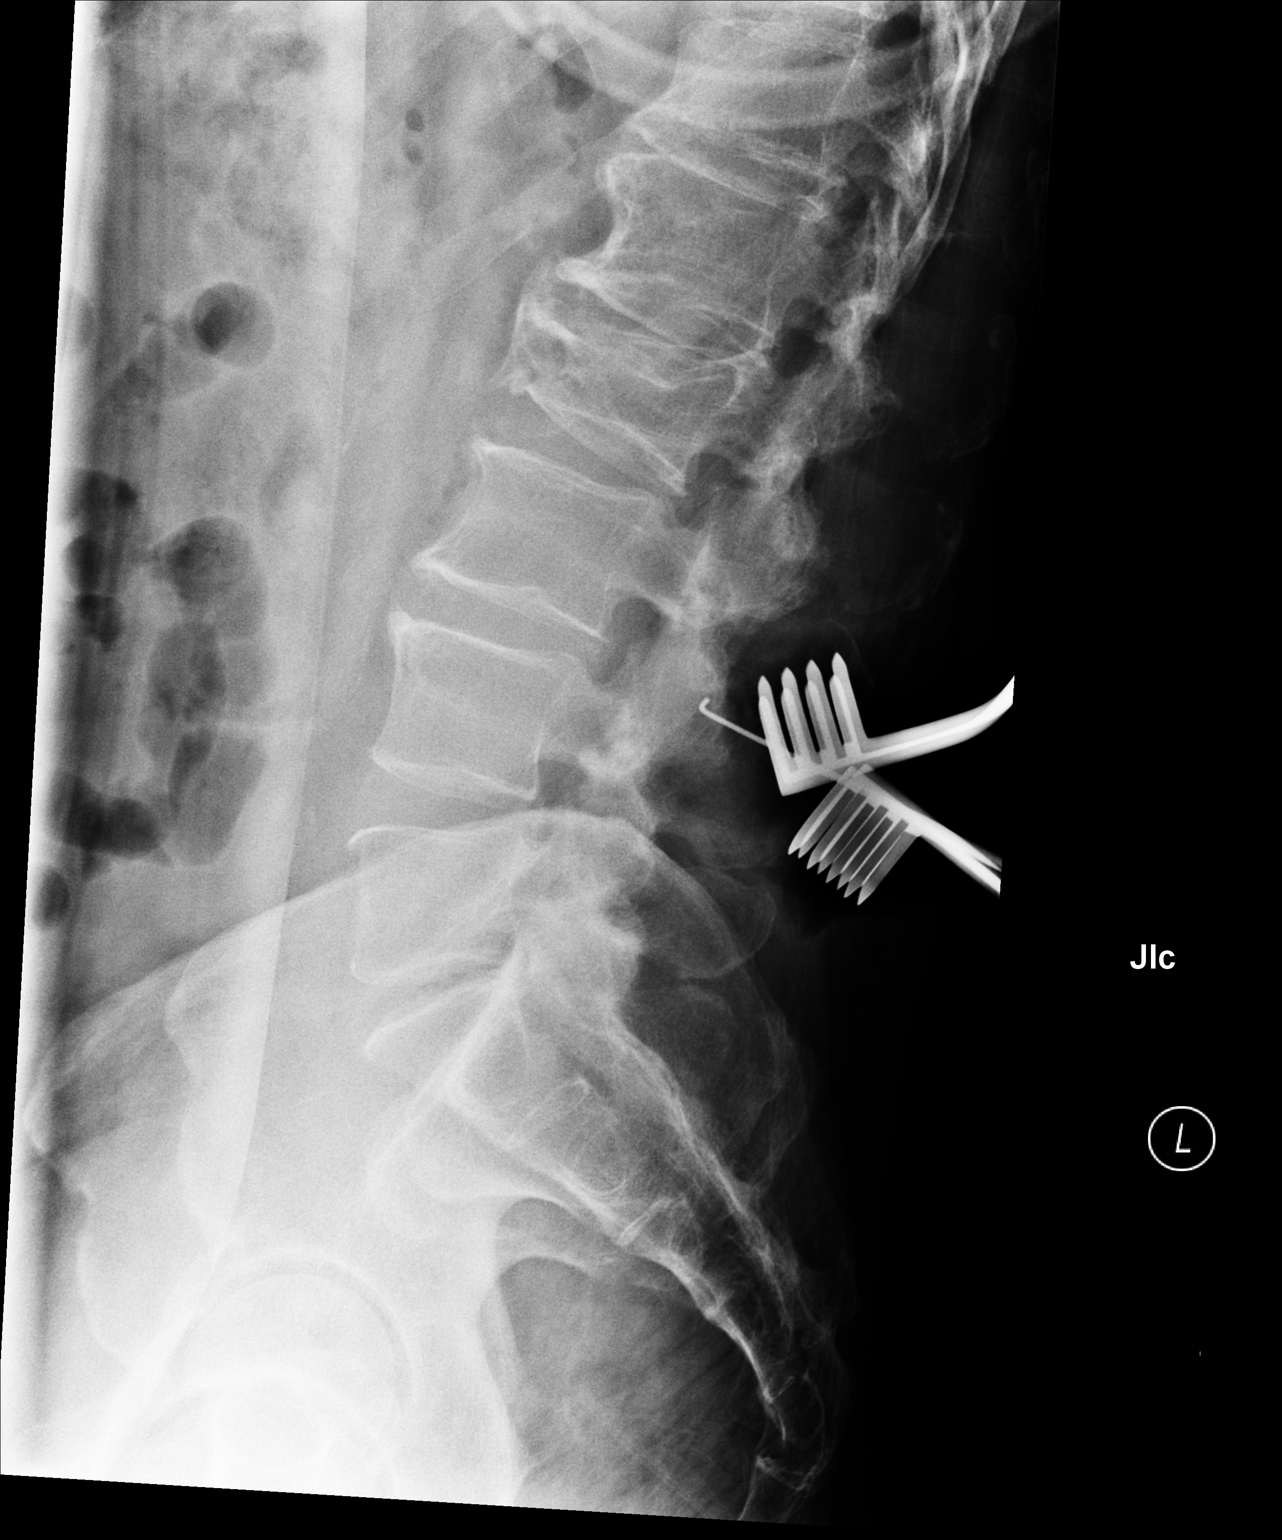

[1 of 1 positions shown; findings below may reference images not displayed]

FINDINGS: Probe is in place at the L3-4 level.  Remote L2 fracture again seen.
IMPRESSION: Probe is at the L3-4 level.

## 2021-09-12 IMAGING — CT CT L SPINE W/ CM
1 of 7 series · 5 of 14 positions shown, 7 images · non-contrast
Comparison: Lumbar spine radiographs 04/17/2020. CT of the lumbar
spine 03/15/2020

CLINICAL DATA: Lumbar spine surgery 4 months ago with extensive
laminectomy. Progressive left lower extremity weakness. Left lower
extremity pain.
TECHNIQUE: Contiguous axial images were obtained through the Lumbar spine after
the intrathecal infusion of infusion. Coronal and sagittal
reconstructions were obtained of the axial image sets.

[Series 3: l spine soft · axial · 0.33mm/px · z∈[-390,-234]mm · 5 of 79 slices shown, 7 images]
[im 14/79  soft-tissue]
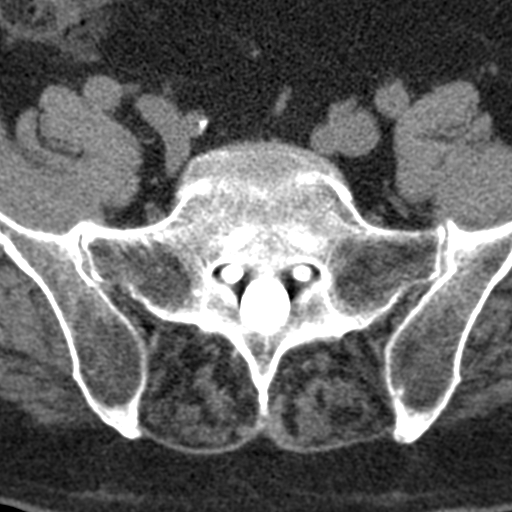
[im 14/79  bone]
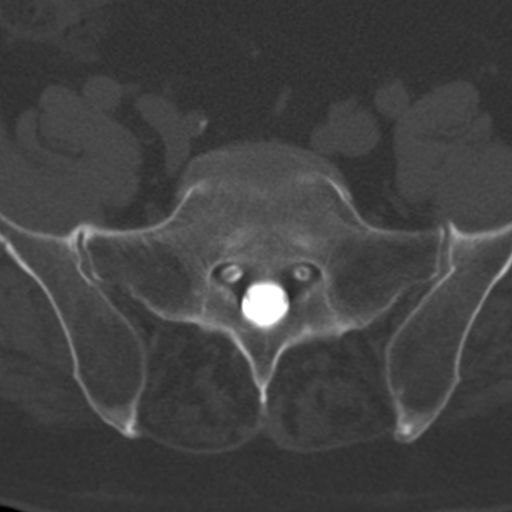
[im 27/79  bone]
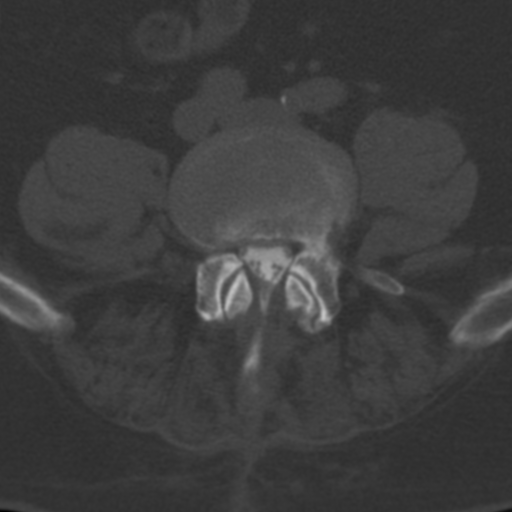
[im 40/79  bone]
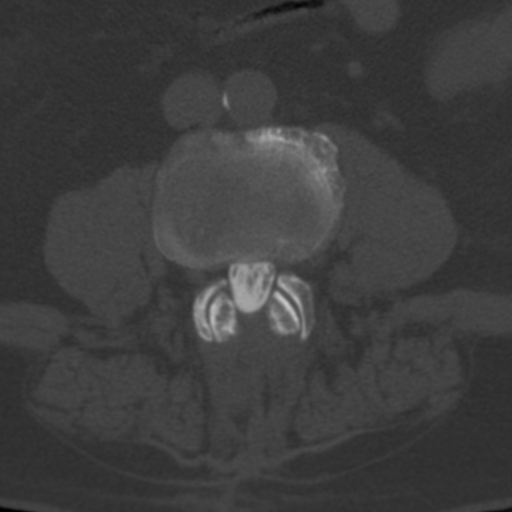
[im 53/79  bone]
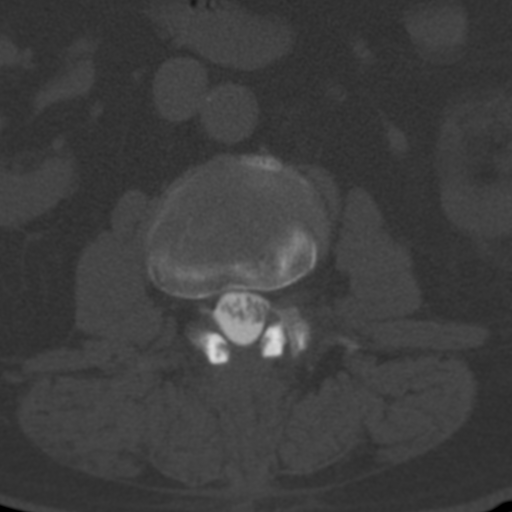
[im 66/79  soft-tissue]
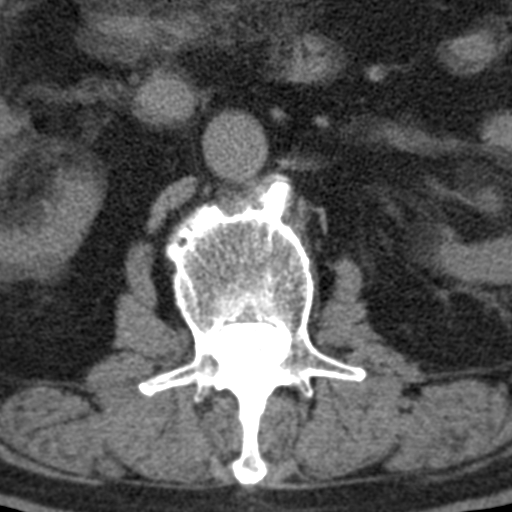
[im 66/79  bone]
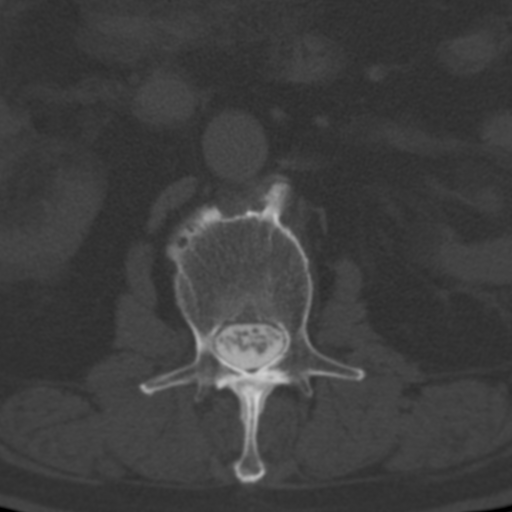

[5 of 14 positions shown; findings below may reference images not displayed]

EXAM:
LUMBAR MYELOGRAM

FLUOROSCOPY TIME:  Radiation Exposure Index (as provided by the
fluoroscopic device): 337.82 uGy*m2

PROCEDURE:
After thorough discussion of risks and benefits of the procedure
including bleeding, infection, injury to nerves, blood vessels,
adjacent structures as well as headache and CSF leak, written and
oral informed consent was obtained. Consent was obtained by Dr.
Sijibomi Zekeri. Time out form was completed.

Patient was positioned prone on the fluoroscopy table. Local
anesthesia was provided with 1% lidocaine without epinephrine after
prepped and draped in the usual sterile fashion. Puncture was
performed at L4 using a 3 1/2 inch 22-gauge spinal needle via
midline approach. Using a single pass through the dura, the needle
was placed within the thecal sac, with return of clear CSF. 15 mL of
Isovue P-DUU was injected into the thecal sac, with normal
opacification of the nerve roots and cauda equina consistent with
free flow within the subarachnoid space.

I personally performed the lumbar puncture and administered the
intrathecal contrast. I also personally supervised acquisition of
the myelogram images.
FINDINGS: LUMBAR MYELOGRAM FINDINGS:

Five non rib-bearing lumbar type vertebral bodies are present. A
remote superior endplate fractures present at L2. Slight
retrolisthesis is present at L1-2 and L2-3. The retrolisthesis at
L2-3 is worse with standing and exacerbated by extension. Alignment
is otherwise stable.

Nerve roots fill normally throughout the lumbar spine. Broad-based
disc protrusion is present at L4-5 and to lesser extent at L2-3 and
L3-4. Atherosclerotic changes are noted in the aorta.

CT LUMBAR MYELOGRAM FINDINGS:

Five non rib-bearing lumbar type vertebral bodies are present.
Slight retrolisthesis is present at L1-2 L3-4. Retrolisthesis L2-3
is stable, measuring 3.5 mm. Remote superior endplate fracture L2 is
stable. Dextroconvex curvature is centered at L2.

Atherosclerotic changes are noted in the aorta and branch vessels
without aneurysm.

T12-L1: Rightward disc bulging is present. Facet hypertrophy is
worse on the right. Vacuum disc is present. No significant stenosis
is present.

L1-2: Wide laminectomy is present. Loss of disc height is present
with a broad-based disc protrusion. Significant stenosis is present.

L2-3: Wide laminectomy is present. The central canal is
decompressed. Leftward disc protrusion and asymmetric left-sided
facet hypertrophy contribute to moderate left foraminal narrowing.

L3-4: A broad-based disc protrusion is present. Wide laminectomy is
present. Posterior canal is decompressed. Disc material extends to
the left without significant stenosis.

L4-5: Asymmetric right-sided facet hypertrophy is present. Wide
laminectomy is present. The central canal is decompressed. Mild
foraminal narrowing bilaterally is worse on right.

L5-S1: Asymmetric right-sided facet hypertrophy is present. Spurring
contributes to moderate right foraminal narrowing. Left foramen is
patent.
IMPRESSION: 1. Wide laminectomy at L2, L3, and L4 with decompression of the
central canal at these levels.
2. Moderate left foraminal stenosis at L2-3 secondary to a leftward
disc protrusion and asymmetric left-sided facet hypertrophy.
3. Mild foraminal narrowing bilaterally at L4-5 is worse on right.
4. Moderate right foraminal stenosis at L5-S1 secondary to spurring.
5. Remote superior endplate fracture at L2.
6. Dextroconvex curvature of the lumbar spine is centered at L2.
7. Retrolisthesis at L2-3 is worse with standing and exacerbated by
extension.
8. Aortic Atherosclerosis ([id number]).

## 2021-11-04 DEATH — deceased
